# Patient Record
Sex: Male | Born: 1945
Health system: Southern US, Community
[De-identification: ages and names within clinical notes are randomized; demographics above are authoritative.]

## PROBLEM LIST (undated history)

## (undated) DIAGNOSIS — A048 Other specified bacterial intestinal infections: Secondary | ICD-10-CM

## (undated) DIAGNOSIS — G479 Sleep disorder, unspecified: Secondary | ICD-10-CM

## (undated) DIAGNOSIS — F419 Anxiety disorder, unspecified: Secondary | ICD-10-CM

## (undated) DIAGNOSIS — C439 Malignant melanoma of skin, unspecified: Secondary | ICD-10-CM

## (undated) DIAGNOSIS — I472 Ventricular tachycardia, unspecified: Secondary | ICD-10-CM

## (undated) DIAGNOSIS — M1990 Unspecified osteoarthritis, unspecified site: Secondary | ICD-10-CM

## (undated) DIAGNOSIS — E559 Vitamin D deficiency, unspecified: Secondary | ICD-10-CM

## (undated) DIAGNOSIS — E7800 Pure hypercholesterolemia, unspecified: Secondary | ICD-10-CM

## (undated) DIAGNOSIS — F4321 Adjustment disorder with depressed mood: Secondary | ICD-10-CM

## (undated) DIAGNOSIS — K219 Gastro-esophageal reflux disease without esophagitis: Secondary | ICD-10-CM

## (undated) DIAGNOSIS — R7303 Prediabetes: Secondary | ICD-10-CM

## (undated) DIAGNOSIS — F32A Depression, unspecified: Secondary | ICD-10-CM

## (undated) DIAGNOSIS — D369 Benign neoplasm, unspecified site: Secondary | ICD-10-CM

## (undated) DIAGNOSIS — E291 Testicular hypofunction: Secondary | ICD-10-CM

## (undated) DIAGNOSIS — J309 Allergic rhinitis, unspecified: Secondary | ICD-10-CM

## (undated) DIAGNOSIS — K635 Polyp of colon: Secondary | ICD-10-CM

## (undated) DIAGNOSIS — F329 Major depressive disorder, single episode, unspecified: Secondary | ICD-10-CM

## (undated) DIAGNOSIS — E039 Hypothyroidism, unspecified: Secondary | ICD-10-CM

## (undated) DIAGNOSIS — C4492 Squamous cell carcinoma of skin, unspecified: Secondary | ICD-10-CM

## (undated) DIAGNOSIS — G4700 Insomnia, unspecified: Secondary | ICD-10-CM

## (undated) HISTORY — DX: Unspecified osteoarthritis, unspecified site: M19.90

## (undated) HISTORY — DX: Polyp of colon: K63.5

## (undated) HISTORY — PX: POLYPECTOMY: SHX149

## (undated) HISTORY — DX: Depression, unspecified: F32.A

## (undated) HISTORY — DX: Gastro-esophageal reflux disease without esophagitis: K21.9

## (undated) HISTORY — DX: Prediabetes: R73.03

## (undated) HISTORY — DX: Squamous cell carcinoma of skin, unspecified: C44.92

## (undated) HISTORY — DX: Anxiety disorder, unspecified: F41.9

## (undated) HISTORY — DX: Sleep disorder, unspecified: G47.9

## (undated) HISTORY — PX: COLONOSCOPY: SHX174

## (undated) HISTORY — DX: Insomnia, unspecified: G47.00

## (undated) HISTORY — DX: Ventricular tachycardia: I47.2

## (undated) HISTORY — DX: Malignant melanoma of skin, unspecified: C43.9

## (undated) HISTORY — PX: TONSILLECTOMY: SUR1361

## (undated) HISTORY — DX: Pure hypercholesterolemia, unspecified: E78.00

## (undated) HISTORY — DX: Major depressive disorder, single episode, unspecified: F32.9

## (undated) HISTORY — DX: Testicular hypofunction: E29.1

## (undated) HISTORY — DX: Allergic rhinitis, unspecified: J30.9

## (undated) HISTORY — DX: Benign neoplasm, unspecified site: D36.9

## (undated) HISTORY — DX: Vitamin D deficiency, unspecified: E55.9

## (undated) HISTORY — DX: Hypothyroidism, unspecified: E03.9

## (undated) HISTORY — DX: Ventricular tachycardia, unspecified: I47.20

## (undated) HISTORY — DX: Adjustment disorder with depressed mood: F43.21

## (undated) HISTORY — DX: Other specified bacterial intestinal infections: A04.8

---

## 1999-10-21 ENCOUNTER — Encounter: Admission: RE | Admit: 1999-10-21 | Discharge: 1999-10-21 | Payer: Self-pay | Admitting: Family Medicine

## 1999-10-21 ENCOUNTER — Encounter: Payer: Self-pay | Admitting: Family Medicine

## 2000-07-26 ENCOUNTER — Ambulatory Visit (HOSPITAL_COMMUNITY): Admission: RE | Admit: 2000-07-26 | Discharge: 2000-07-26 | Payer: Self-pay | Admitting: Gastroenterology

## 2002-03-02 ENCOUNTER — Encounter: Admission: RE | Admit: 2002-03-02 | Discharge: 2002-03-02 | Payer: Self-pay | Admitting: Otolaryngology

## 2002-03-02 ENCOUNTER — Encounter: Payer: Self-pay | Admitting: Otolaryngology

## 2003-04-07 ENCOUNTER — Inpatient Hospital Stay (HOSPITAL_COMMUNITY): Admission: EM | Admit: 2003-04-07 | Discharge: 2003-04-10 | Payer: Self-pay | Admitting: Emergency Medicine

## 2003-08-06 ENCOUNTER — Ambulatory Visit (HOSPITAL_COMMUNITY): Admission: RE | Admit: 2003-08-06 | Discharge: 2003-08-06 | Payer: Self-pay | Admitting: Internal Medicine

## 2004-10-16 ENCOUNTER — Ambulatory Visit: Payer: Self-pay | Admitting: Internal Medicine

## 2007-10-28 DIAGNOSIS — D369 Benign neoplasm, unspecified site: Secondary | ICD-10-CM

## 2007-10-28 HISTORY — DX: Benign neoplasm, unspecified site: D36.9

## 2010-01-05 HISTORY — PX: LEFT HEART CATH: CATH118248

## 2010-05-23 NOTE — Consult Note (Signed)
NAME:  Samuel Turner, Samuel Turner NO.:  [id number]   MEDICAL RECORD NO.:  [id number]                   PATIENT TYPE:  INP   LOCATION:  2024                                 FACILITY:  [facility]   PHYSICIAN:  Doylene Canning. Ladona Ridgel, M.D.               DATE OF BIRTH:  [birth year]   DATE OF CONSULTATION:  04/10/2003  DATE OF DISCHARGE:  04/10/2003                                   CONSULTATION   REQUESTING PHYSICIAN:  Armanda Magic, M.D.   REASON FOR CONSULTATION:  Evaluation of symptomatic, nonsustained, wide QRS  tachycardia.   HISTORY OF PRESENT ILLNESS:  The patient is a 65 year old man with a history  of 4 weeks' of palpitations associated with dizziness and near-syncope.  He  denies frank syncope. Also, he has intermittent chest pain. These are  sometimes with, but sometimes now with his palpitations.  The patient wore a  cardiac monitor which demonstrated several runs of nonsustained ventricular  tachycardia at cycle lengths between 280-300 msec.  He was now referred for  additional evaluation and treatment.  He denies any history of syncope or  near syncope.   His catheterization demonstrated normal coronary arteries and normal left  ventricular systolic function.   PAST MEDICAL HISTORY:  Additionally notable for some depression and history  of dyslipidemia.   PAST SURGICAL HISTORY:  Tonsillectomy.   ALLERGIES:  PREDNISONE.   MEDICATIONS:  Xanax p.r.n. and Wellbutrin.   SOCIAL HISTORY:  Patient is married. He denies tobacco abuse with no smoking  for 20 years, rarely uses any alcoholic beverages. He does drink caffeinated  beverages.   REVIEW OF SYSTEMS:  Negative for vision or hearing problems. He denies any  fevers, chills or night sweats, denies headache. He does wear glasses for  reading. Denies any skin rashes. He has chest pain as previously noted.  Denies shortness of breath, dyspnea, orthopnea, syncope, claudication, cough  or hemoptysis. He does feel  palpitations and presyncope as previously noted,  which have been demonstrated to be due to his nonsustained VT.  He denies  dysuria, hematuria or nocturia, weakness or numbness. He does have rare  depression.  Denies nausea or vomiting, constipation, polyuria or  polydipsia.  NEUROLOGIC:  Unremarkable.   PHYSICAL EXAMINATION:  VITAL SIGNS:  Blood pressure 128/64, pulse 64 and  regular, respirations 18, temperature 97.1, weight 180 pounds.  GENERAL:  A pleasant, well-appearing middle-aged man in no distress.  HEENT:  Normocephalic, atraumatic.  Pupils are equal, round. Oropharynx  moist, sclerae anicteric.  NECK:  No jugulovenous distention, no thyromegaly, trachea midline. Carotids  are 2+ and symmetric.  LUNGS:  Clear bilaterally to auscultation. There are no wheezes, rales or  rhonchi.  CARDIOVASCULAR:  Regular rate and rhythm, normal S1 and S2. No murmurs, rubs  or gallops.  ABDOMEN:  Soft, nontender, nondistended. No organomegaly.  EXTREMITIES:  No clubbing, cyanosis or edema.  Pulses 2+ and symmetric.  SKIN:  No lesions or erythematous macules or papules.  NEUROLOGIC:  Alert and oriented x3. Cranial nerves II-XII grossly intact.  Strength 5/5 and symmetric.   LABORATORY DATA:  EKG demonstrated sinus rhythm with a delay in his R wave  progression from V1-V3.  There is a small, terminal deflection in lead V2,  though I do not think this is diagnostic of an epsilon wave.   IMPRESSION:  1. Symptomatic nonsustained ventricular tachycardia and premature     ventricular contractions.  2. History of depression.  3. Dyslipidemia.   DISCUSSION:  The mechanism of the patient's symptoms are unclear.  With  normal LV function and no coronary disease, they are likely not life-  threatening, although he could certainly still have arrhythmogenic, right  ventricular dysplasia.   I would recommend he undergo electrophysiologic study. If he has RV outflow  tract which is sustained; then  catheter placement would be warranted. If he  has no sustained, inducible arrhythmias, then medical therapy with beta-  blockers would be warranted. If the patient did have evidence of  arrhythmogenic RV dysplasia or other infiltrating cardiomyopathies with  multiple inducible VT's, with programmed stimulation, then ICD insertion  would be warranted.                                               Doylene Canning. Ladona Ridgel, M.D.    GWT/MEDQ  D:  04/09/2003  T:  04/10/2003  Job:  [id number]   cc:   Armanda Magic, M.D.  [address], [address]  [place], [place] [address]  Fax: [phone]   [facility] [employer]

## 2010-05-23 NOTE — Procedures (Signed)
[facility]  Patient:    Samuel Turner, Samuel Turner                      MRN: [id number] Proc. Date: 07/26/00 Adm. Date:  [id number] Attending:  Louie Bun CC:         Elana Alm Eliezer Lofts., M.D.   Procedure Report  PROCEDURE:  Colonoscopy.  INDICATION FOR PROCEDURE:  Screening colonoscopy in a 65 year old patient with no recent colon screening.  DESCRIPTION OF PROCEDURE:  The patient was placed in the left lateral decubitus position and placed on the pulse monitor with continuous low-flow oxygen delivered by nasal cannula.  He was sedated with 4 mg IV Demerol and 50 mg IV Versed in addition to the medications received for the previous EGD. The Olympus video colonoscope was inserted into the rectum and advanced to the cecum, confirmed by transillumination at McBurneys point and visualization of the ileocecal valve and appendiceal orifice.  The prep was somewhat suboptimal proximal to the hepatic flexure with some semisoft stool adherent to the walls of the mucosa despite fairly vigorous syringe irrigation, and I could not rule out lesions less than 1 cm in all areas, particularly proximal to the hepatic flexure.  Otherwise, the cecum, ascending, transverse, descending, and sigmoid colon all appeared normal with no masses, polyps, diverticula, or other mucosal abnormalities.  The rectum likewise appeared normal, and retroflex view of the anus did reveal some small internal hemorrhoids.  The colonoscope was then withdrawn, and the patient returned to the recovery room in stable condition.  He tolerated the procedure well, and there were no immediate complications.  IMPRESSION:  Internal hemorrhoids, otherwise normal colonoscopy.  PLAN:  Average risk colon screening with repeat flexible sigmoidoscopy and Hemoccults in five years. DD:  07/26/00 TD:  07/24/00 Job: [id number]

## 2010-05-23 NOTE — Cardiovascular Report (Signed)
NAME:  Samuel Turner, Samuel Turner                         ACCOUNT NO.:  [id number]   MEDICAL RECORD NO.:  [id number]                   PATIENT TYPE:  INP   LOCATION:  2024                                 FACILITY:  [facility]   PHYSICIAN:  Francisca December, M.D.               DATE OF BIRTH:  [birth year]   DATE OF PROCEDURE:  04/09/2003  DATE OF DISCHARGE:                              CARDIAC CATHETERIZATION   PROCEDURE PERFORMED:  1. Left heart catheterization.  2. Coronary angiography.  3. Left ventriculogram.   INDICATION:  Mr. Glosser is a 65 year old man who was admitted to [facility] April 06, 2003 with chest pain and nonsustained ventricular  tachycardia.  He has ruled out for myocardial infarction.  He is brought now  to catheterization laboratory to identify possible coronary artery disease  as an etiology.   PROCEDURAL NOTE:  The patient was brought to the cardiac catheterization  laboratory in the fasting state.  The right groin was prepped and draped in  the usual sterile fashion.  Local anesthesia was obtained with the  infiltration of 1% lidocaine.  A 6 French catheter sheath was inserted  percutaneously into the right femoral artery utilizing an anterior approach  over a guiding J wire.  A 110-cm pigtail catheter was used to measure  pressures in the ascending aorta and in the left ventricle both prior to and  following the ventriculogram.  A 30-degree RAO cine left ventriculogram was  performed utilizing a power injector.  A 6 French #4 left and right Judkins  catheters were then used to perform a cineangiography of each coronary  artery in multiple LAO and RAO projections.  All catheter manipulations were  performed using fluoroscopic observation and exchanges performed over long  guiding J wire.  At the completion of the procedure, the catheter and  catheter sheaths were removed.  Hemostasis was achieved by direct pressure.  The patient was transported to the recovery area in  stable condition with an  intact distal pulse.   HEMODYNAMICS:  Systemic arterial pressure was 110/63 with a mean of 84 mmHg.  There was no systolic gradient across the aortic valve.  The left  ventricular end-diastolic pressure was 10 mmHg.   ANGIOGRAPHY:  The left ventriculogram on both injections resulted in a  significant degree of ventricular tachycardia.  There were no regional wall  motion abnormalities and left ventricular systolic function was well within  normal limits.   There was a right dominant coronary system present.  The main left coronary  artery was normal.   The left anterior descending artery and its branches were without  significant obstruction.  There is a 30% focal stenosis in the distal  portion after the origin of a first large diagonal, but anatomically the  third diagonal.  The anterior descending reaches but does not traverse the  apex.   The left circumflex coronary artery was without significant  obstruction.  The vessel really amounts to a single large marginal branch.  It bifurcates  on the lateral wall of the heart.  It has no obstructions whatsoever.   The right coronary artery and its branches were similarly free of disease.  No obstructions are seen whatsoever.  It gives rise to a moderate size  posterior descending artery and a moderate size posterior lateral segment  with two left ventricular branches.  No obstructions are seen in these  branch arteries.  Collateral vessels are not seen.   FINAL IMPRESSION:  1. Normal left ventricular size and global systolic function.  Ejection     fraction 65-70%.  2. No evidence of significant atherosclerotic coronary vascular disease.                                               Francisca December, M.D.    JHE/MEDQ  D:  04/09/2003  T:  04/10/2003  Job:  [id number]   cc:   Molly Maduro A. Nicholos Johns, M.D.  [address]  [place]  [place] [address]  Fax: [phone]

## 2010-05-23 NOTE — Discharge Summary (Signed)
NAME:  Samuel Turner, Samuel Turner                         ACCOUNT NO.:  [id number]   MEDICAL RECORD NO.:  [id number]                   PATIENT TYPE:  INP   LOCATION:  2024                                 FACILITY:  [facility]   PHYSICIAN:  Armanda Magic, M.D.                  DATE OF BIRTH:  1945-10-08   DATE OF ADMISSION:  04/07/2003  DATE OF DISCHARGE:  04/10/2003                                 DISCHARGE SUMMARY   CHIEF COMPLAINT/REASON FOR ADMISSION:  Mr. Urwin was evaluated in the ER.  He is 65 years old.  He is complaining of palpitations and chest pain.  He  has no prior history of cardiac disease.  He has had palpitations for about  1-1/2 months.  Over the past year, they have been increasing in frequency.  He was wearing a monitor on Friday and was noted at the [facility] Cardiology  office to have had nonsustained ventricular tachycardia, 16 beats  episodically.  The patient was notified and recommended that he come to the  ER, but he wanted to wait and be seen on Monday.  In the p.m. and prior to  coming to the ER and on the morning if coming to the ER, the patient had an  episode of palpitations and chest pain and presented to the ER.  He has also  been having chest for about 1-1/2 months intermittently.  This has increased  intermittently for about two times a day, of unclear duration.  The pain was  located midsternally and does not radiate.  He has no associated symptoms  except for dizziness.  This does occur with chest pain and with the  palpitations.  He notes that his palpitations increase with exertion such as  weight lifting or doing his usual daily work.  He does not have chest pain  with regular type of exercise.  Currently, he is experiencing a dull ache in  his chest.   FAMILY HISTORY:  Significant for a father who died in his 6's of coronary  artery disease.   PHYSICAL EXAMINATION:  VITAL SIGNS:  On initial exam, the patient blood  pressure was elevated slightly at 154/73,  heart rate 69.  He was afebrile.  GENERAL:  Physical examination was otherwise unremarkable.   EKG shows sinus rhythm, ventricular rate 69 beats per minute, no ST-T wave  changes.   DIAGNOSES:  The patient was admitted by Dr. Mayford Knife with the following  diagnoses:  1. Recurrent chest pain worrisome for unstable angina.  EKG is currently     without ischemic changes.  2. Know episode of nonsustained ventricular tachycardia in a patient with     palpitations.  Questionable underlying coronary artery disease.  3. Dizziness secondary to nonsustained ventricular tachycardia.  4. History of depression and attention deficit hyperactivity disorder,     currently on medications.   HOSPITAL COURSE:  1. Chest pain.  The patient  was admitted to the medical telemetry unit where     he was started on Lovenox, beta-blockers, as well as morphine and Tylenol     for pain.  He was also ordered sublingual nitroglycerin as well as IV     nitroglycerin as indicated for chest pain, and his usual home medications     were continued.  TSH was checked due to patient's palpitations, and     initial laboratory work was within normal limits.  Potassium 3.6, BUN 14,     creatinine 1.2, hemoglobin 14.4, normal white count.  Platelets 242,000.     Initial cardiac isoenzymes were negative.  On April 10, 2003, Dr. Mayford Knife     determined that the patient needed to undergo cardiac catheterization to     determine if there was any ischemic etiology to the chest pain as well to     the palpitations, especially with nonsustained ventricular tachycardia.     The patient underwent cardiac catheterization on April 09, 2003, per Dr.     Francisca December.  It was a normal catheterization with normal LV     function, no coronary artery disease.  On April 09, 2003, Dr. Mayford Knife     called electrophysiology physician to come and evaluate the patient.  Dr.     Ladona Ridgel did evaluate the patient on that same day due to the patient's      problems with symptomatic nonsustained wide QRS tachycardia.  Their     discussion revealed that the patient's symptoms were unclear.  He had     normal LV function, no coronary disease, and felt that the nonsustained     ventricular tachycardia was likely not life threatening although he could     certainly have arrhythmogenic right ventricular dysplasia.  He     recommended that the patient undergo electrophysiology study.  If he had     RV outflow tract which is sustained, then catheter placement would be     warranted.  If no sustained inducible arrhythmias, then medical therapy     with beta blockers warranted.   If he did have evidence of arrhythmogenic RV dysplasia or other infiltrating  cardiomyopathies with multiple inducible VT's with program stimulation, then  ICD insertion would be warranted.   The patient did undergo an EP study on May 10, 2003, without any clear  evidence of arrhythmogenic RV dysplasia or other infiltrating  cardiomyopathies, and the recommendations were to continue beta-blockers  increasing as the patient can tolerate, and if the patient did not tolerate  Lopressor, consider using Inderal LA or nadolol as well, and could use a  calcium channel blocker as well to control the ventricular rate.  By May 10, 2003, based on all these findings, Dr. Mayford Knife determined that the patient  was stable for discharge home, and the patient agreed.  The patient was  discharged home on the above-stated date.   LABORATORY DATA:  On date of discharge, revealed that the patient's  hemoglobin was 12.9, white count 6900, platelet count 220,000.  Potassium  3.8.  BUN 16, creatinine 1.1.  TSH was 1.423.  A lipid panel had also been  checked this hospitalization with cholesterol of 200, triglycerides  72.  HDL 81, and LDL 105.   DISCHARGE DIAGNOSES:  1. Chest pain, non-cardiac.  Normal cardiac catheterization. 2. Nonsustained ventricular tachycardia, nonischemic with unremarkable  EP     study.  Normal TSH.  3. Borderline elevated LDL.  The patient  does not have known coronary artery     disease, or finding at his cath.  The LDL of 105 is appropriate at this     point.   OTHER MEDICAL PROBLEMS:  As listed previously.   DISCHARGE MEDICATIONS:  1. Lopressor 50 mg b.i.d.  2. Aspirin 325 mg daily.  3. Wellbutrin XL 300 mg daily.  4. Xanax as needed.   DISCHARGE DIET:  Cardiac, heart healthy.   WOUND CARE:  Routine post EP study.  No bending, stooping, straining or  lifting greater than five pounds for the next two days.   OTHER INSTRUCTIONS:  1. Call if you begin experiencing further palpitations.  2. Follow up appointment to see Dr. Mayford Knife on April 20 at 1:15 p.m.  3. He is also to have a cardiac MRI as an outpatient to rule out RV     dysplasia.  4. Again, the EP study did not reveal any evidence of this, but it is     recommended that he have a cardiac MRI to fully evaluate this.      Allison L. Ulla Gallo, M.D.    ALE/MEDQ  D:  06/13/2003  T:  06/14/2003  Job:  [id number]   cc:   Doylene Canning. Ladona Ridgel, M.D.   Rosanne Gutting  [address]  [place]  [place] [address]  Fax: [phone]

## 2010-05-23 NOTE — Procedures (Signed)
[facility]  Patient:    GRAY, DOERING                      MRN: [id number] Proc. Date: 07/26/00 Adm. Date:  [id number] Attending:  Louie Bun CC:         Elana Alm Eliezer Lofts., M.D.   Procedure Report  PROCEDURE:  Esophagogastroduodenoscopy.  INDICATION FOR PROCEDURE:  Gastroesophageal reflux in a patient set up for colonoscopy immediately to follow this procedure.  DESCRIPTION OF PROCEDURE:   The patient was placed in the left lateral decubitus position and placed on the pulse monitor with continuous low-flow oxygen delivered by nasal cannula.  He was sedated with 50 mg IV Demerol and 6 mg IV Versed.  The Olympus video endoscope was advanced under direct vision into the oropharynx and esophagus.  The esophagus was straight and of normal caliber with the squamocolumnar line at 38 cm.  There was a small, approximately 1 cm hiatal hernia and a 1 cm esophageal erosion with no exudate or ulceration, consistent with a grade 1 esophagitis.  The stomach was entered, and a small amount of liquid secretions were suctioned from the fundus.  Retroflex view of the cardia was unremarkable.  The fundus, body, antrum, and pylorus all appeared normal.  The duodenum was entered, and both the bulb and the second portion were well inspected and appeared to be within normal limits.  The scope was then withdrawn and the patient prepared for colonoscopy.  He tolerated the procedure well, and there were no immediate complications.  IMPRESSION: 1. Grade 1 esophagitis. 2. Small hiatal hernia.  PLAN:  Dietary and lifestyle modifications for reflux and medical therapy as needed. DD:  07/26/00 TD:  07/24/00 Job: [id number]

## 2010-05-23 NOTE — Op Note (Signed)
NAME:  Samuel Turner, Samuel Turner NO.:  [id number]   MEDICAL RECORD NO.:  [id number]                   PATIENT TYPE:  INP   LOCATION:  2024                                 FACILITY:  [facility]   PHYSICIAN:  Duke Salvia, M.D.               DATE OF BIRTH:  December 26, 1945   DATE OF PROCEDURE:  04/10/2003  DATE OF DISCHARGE:  04/10/2003                                 OPERATIVE REPORT   PREOPERATIVE DIAGNOSIS:  Nonsustained ventricular tachycardia.   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   PROCEDURE:  Invasive electrophysiological study with isoproterenol infusion.   Following obtaining informed consent, the patient was brought to the  electrophysiology laboratory and placed on the fluoroscopic table in the  supine position.  After routine prep and drape, cardiac catheterization was  performed with local anesthesia and conscious sedation.  Noninvasive blood  pressure monitoring and transcutaneous oxygen saturation monitoring were  performed continuously throughout the procedure.  Following the procedure,  the catheters were removed and hemostasis was obtained, and the patient was  transferred to the floor in stable condition.   The catheter was a 5-French quadripolar catheter that was inserted into the  left femoral vein to the right ventricular apex and subsequently moved to  the right ventricular outflow tract.   A 5-French quadripolar catheter was inserted in the left femoral vein in the  A-V junction and then moved to the high right atrium.   Source leads I, aVF and V1 were monitored continuously throughout the  procedure.  Following insertion of the catheters, the stimulation protocol  included incremental atrial pacing, intraventricular pacing, single atrial  external stimuli.   The paced cycle went to 600 milliseconds.  A single  double ventricular external stimuli from the right ventricular apex and the  paced cycle went to 350:500 milliseconds.  Double and triple ventricular  external stimuli from the right ventricular apex and the paced cycle went to  500:350 milliseconds.   RESULTS:  ELECTROCARDIOGRAM:  1. Rhythm is sinus.  2. Cycle length is 933 milliseconds.  3. PR interval is 193 milliseconds.  4. QRS duration is 106 milliseconds.  5. QT interval is 387 milliseconds.  6. P-wave duration was normal.  7. Bundle branch block was absent.  8. Pre-excitation was absent.   H interval was 106 milliseconds.  H-V interval was 59 milliseconds.   AV NODAL FUNCTION:  1. The AV Wenckebach cycle length was 350 milliseconds.  2. VA conduction was dissociate at 600 milliseconds.  3. The AV nodal effective refractory period at a paced cycle length of 600     msec was 320 milliseconds.   ACCESSORY PATHWAY FUNCTION:  No evidence of an accessory pathway was  identified.   VENTRICULAR RESPONSE TO PROGRAM STIMULATION:  The effective refractory  period at  the right ventricular apex at a paced cycle length of 500  milliseconds was 240 milliseconds, and at 350 milliseconds was 220  milliseconds.   The closest coupling interval at the right ventricular apex at a paced cycle  length of 500 milliseconds was 240:200:200, and at 350 milliseconds was  230:210:210.   ARRHYTHMIAS INDUCED:  Repeated episodes of nonsustained ventricular  tachycardia were induced in the setting of isoproterenol at 2-4 mcg/minute  using primarily burst ventricular stimulation.  Programmed stimulation  failed to induce the tachycardia.   IMPRESSION:  1. Normal sinus function.  2. Normal atrial function.  3. Normal A-V nodal function.  4. Normal __________  function.  5. No accessory pathway.  6. Ventricular testing demonstrated the presence of an automatic right     ventricular tachycardia  that appeared to originate by morphology criteria     from the right ventricular outflow tract.  This behavior, i.e. his     response to burst stimulation and isoproterenol infusion were consistent     with automatic focus and more consistent with right ventricular outflow     tract ventricular tachycardia as opposed to arrhythmogenic RV dysplasia.                                               Duke Salvia, M.D.    SCK/MEDQ  D:  04/13/2003  T:  04/15/2003  Job:  [id number]   cc:   Armanda Magic, M.D.  [address], [address]  [place], [place] [address]  Fax: [phone]   Electrophysiology Laboratory

## 2012-08-11 ENCOUNTER — Encounter: Payer: Self-pay | Admitting: Internal Medicine

## 2012-09-12 ENCOUNTER — Encounter: Payer: Self-pay | Admitting: Internal Medicine

## 2012-09-13 ENCOUNTER — Other Ambulatory Visit (INDEPENDENT_AMBULATORY_CARE_PROVIDER_SITE_OTHER): Payer: 59

## 2012-09-13 ENCOUNTER — Encounter: Payer: Self-pay | Admitting: Internal Medicine

## 2012-09-13 ENCOUNTER — Ambulatory Visit (INDEPENDENT_AMBULATORY_CARE_PROVIDER_SITE_OTHER): Payer: 59 | Admitting: Internal Medicine

## 2012-09-13 VITALS — BP 118/62 | HR 70 | Ht 72.0 in | Wt 186.0 lb

## 2012-09-13 DIAGNOSIS — Z86010 Personal history of colonic polyps: Secondary | ICD-10-CM

## 2012-09-13 DIAGNOSIS — R195 Other fecal abnormalities: Secondary | ICD-10-CM

## 2012-09-13 DIAGNOSIS — Z860101 Personal history of adenomatous and serrated colon polyps: Secondary | ICD-10-CM | POA: Insufficient documentation

## 2012-09-13 DIAGNOSIS — R141 Gas pain: Secondary | ICD-10-CM

## 2012-09-13 DIAGNOSIS — R140 Abdominal distension (gaseous): Secondary | ICD-10-CM

## 2012-09-13 DIAGNOSIS — Z8619 Personal history of other infectious and parasitic diseases: Secondary | ICD-10-CM

## 2012-09-13 LAB — TSH: TSH: 1.01 u[IU]/mL (ref 0.35–5.50)

## 2012-09-13 MED ORDER — ALIGN PO CAPS: 1.0000 | ORAL_CAPSULE | Freq: Every day | ORAL | Status: DC

## 2012-09-13 MED ORDER — PEG-KCL-NACL-NASULF-NA ASC-C 100 G PO SOLR: 1.0000 | Freq: Once | ORAL | Status: DC

## 2012-09-13 NOTE — Patient Instructions (Addendum)
You have been scheduled for a colonoscopy with propofol. Please follow written instructions given to you at your visit today.  Please pick up your prep kit at the pharmacy within the next 1-3 days. If you use inhalers (even only as needed), please bring them with you on the day of your procedure. Your physician has requested that you go to www.startemmi.com and enter the access code given to you at your visit today. This web site gives a general overview about your procedure. However, you should still follow specific instructions given to you by our office regarding your preparation for the procedure.  Your physician has requested that you go to the basement for lab work before leaving today.  We have sent the following medications to your pharmacy for you to pick up at your convenience: Moviprep; you were given instructions today at your visit                                               We are excited to introduce MyChart, a new best-in-class service that provides you online access to important information in your electronic medical record. We want to make it easier for you to view your health information - all in one secure location - when and where you need it. We expect MyChart will enhance the quality of care and service we provide.  When you register for MyChart, you can:    View your test results.    Request appointments and receive appointment reminders via email.    Request medication renewals.    View your medical history, allergies, medications and immunizations.    Communicate with your physician's office through a password-protected site.    Conveniently print information such as your medication lists.  To find out if MyChart is right for you, please talk to a member of our clinical staff today. We will gladly answer your questions about this free health and wellness tool.  If you are age 23 or older and want a member of your family to have access to your record, you must  provide written consent by completing a proxy form available at our office. Please speak to our clinical staff about guidelines regarding accounts for patients younger than age 67.  As you activate your MyChart account and need any technical assistance, please call the MyChart technical support line at [phone]) or email your question to [email or web address]. If you email your question(s), please include your name, a return phone number and the best time to reach you.  If you have non-urgent health-related questions, you can send a message to our office through MyChart at [email or web address]. If you have a medical emergency, call 911.  Thank you for using MyChart as your new health and wellness resource!   MyChart licensed from [employer],  1191-4782. Patents Pending.

## 2012-09-13 NOTE — Progress Notes (Signed)
Patient ID: Samuel Turner, male   DOB: 11/03/1945, 67 y.o.   MRN: [id number] HPI: Mr. Gentles is a 67 year old male with a past medical history of adenomatous colon polyp, removed H. pylori infection, and depression who is seen to discuss surveillance colonoscopy. He is here alone today. He reports he is feeling well. He does report intermittent, long-standing lower abdominal bloating, borborygmi and loose stools. This occurs infrequently maybe once every 2-3 weeks. His stools are not bloody nor melenic. On these days he'll have 4-5 stools a day. Otherwise he reports 1-2 formed brown stools daily. He has no abdominal discomfort, bloating or pain outside of his episodes of more loose stool. He reports a good appetite. No heartburn. No trouble swallowing. No nausea or vomiting.  He recalls colonoscopy in 2009 with one polyp removed and also an upper endoscopy which really H. pyloric gastritis. He reports being treated with antibiotics for this infection and having post treatment breath test which was reportedly negative  Past Medical History  Diagnosis Date  . Tubular adenoma 10/28/2007  . Depression   . H. pylori infection     History reviewed. No pertinent past surgical history.  Current Outpatient Prescriptions  Medication Sig Dispense Refill  . ALPRAZolam (XANAX) 0.5 MG tablet Take 0.5 mg by mouth at bedtime as needed for sleep.      Marland Kitchen aspirin 81 MG chewable tablet Chew 81 mg by mouth daily.      Marland Kitchen atenolol (TENORMIN) 25 MG tablet Take 25 mg by mouth daily.      Marland Kitchen buPROPion (WELLBUTRIN XL) 300 MG 24 hr tablet Take 300 mg by mouth daily.      . cholecalciferol (VITAMIN D) 1000 UNITS tablet Take 1,000 Units by mouth daily.      . Multiple Vitamin (MULTIVITAMIN) tablet Take 1 tablet by mouth daily.      . Omega-3 Fatty Acids (FISH OIL) 1000 MG CAPS Take 1 capsule by mouth daily.      Marland Kitchen zolpidem (AMBIEN) 10 MG tablet Take 5 mg by mouth at bedtime as needed for sleep.       No current  facility-administered medications for this visit.    No Known Allergies  Family History  Problem Relation Age of Onset  . Colon cancer Paternal Grandmother   . Ovarian cancer Mother   . Heart disease Father     History  Substance Use Topics  . Smoking status: Former Games developer  . Smokeless tobacco: Never Used  . Alcohol Use: Yes    ROS: As per history of present illness, otherwise negative  BP 118/62  Pulse 70  Ht 6' (1.829 m)  Wt 186 lb (84.369 kg)  BMI 25.22 kg/m2 Constitutional: Well-developed and well-nourished. No distress. HEENT: Normocephalic and atraumatic. Oropharynx is clear and moist. No oropharyngeal exudate. Conjunctivae are normal.  No scleral icterus. Neck: Neck supple. Trachea midline. Cardiovascular: Normal rate, regular rhythm and intact distal pulses. No M/R/G Pulmonary/chest: Effort normal and breath sounds normal. No wheezing, rales or rhonchi. Abdominal: Soft, nontender, nondistended. Bowel sounds active throughout. There are no masses palpable. No hepatosplenomegaly. Very small scar left lateral to umbilicus Extremities: no clubbing, cyanosis, or edema Lymphadenopathy: No cervical adenopathy noted. Neurological: Alert and oriented to person place and time. Skin: Skin is warm and dry. No rashes noted. Psychiatric: Normal mood and affect. Behavior is normal.  EGD and colonoscopy performed on 10/28/2007 by Dr. Mariana Kaufman Upper endoscopy revealed normal esophagus, nodular gastric mucosa with cold forceps biopsy and  a normal duodenum. Colonoscopy to the cecum. The 5 mm polyp in the rectum removed with cold snare. Pathology results = tubular adenoma from rectal polyp.  Gastric biopsies = mild to moderate active chronic gastritis with mild foveolar hyperplasia. Helicobacter organisms identified  ASSESSMENT/PLAN:  67 year old male with a past medical history of adenomatous colon polyp, removed H. pylori infection, and depression who is seen to discuss  surveillance colonoscopy.  1.  Hx of adenomatous colon polyp -- I recommended repeat colonoscopy in October which will be 5 years from his last exam. This is based on current nationally recognized guidelines for adenoma surveillance. The test was discussed and he is agreeable to proceed.  2.  History of H. Pylori -- I recommended an H. pylori stool antigen to confirm eradication.  3.  Intermittent loose stools/borborygmi -- these symptoms are unpredictable for him and long-standing. We will confirm H. pylori eradication as discussed in #2. Colonoscopy also as discussed to #1 also help rule out any colonic pathology.  These symptoms seem somewhat irritable in nature. We discussed lactose intolerance, and this does not seem to be the case. I will check a TSH today and celiac panel. I recommended a daily probiotic, either Aign or Florastor OTC per box instructions.  We can assess his response to probiotic at the time of his colonoscopy in October

## 2012-09-15 LAB — HELICOBACTER PYLORI  SPECIAL ANTIGEN: H. PYLORI Antigen: NEGATIVE

## 2012-09-23 ENCOUNTER — Encounter: Payer: Self-pay | Admitting: Gastroenterology

## 2012-10-21 ENCOUNTER — Other Ambulatory Visit: Payer: Self-pay | Admitting: Gastroenterology

## 2012-10-21 ENCOUNTER — Encounter: Payer: Self-pay | Admitting: Internal Medicine

## 2012-10-21 ENCOUNTER — Other Ambulatory Visit (INDEPENDENT_AMBULATORY_CARE_PROVIDER_SITE_OTHER): Payer: 59

## 2012-10-21 ENCOUNTER — Ambulatory Visit (AMBULATORY_SURGERY_CENTER): Payer: 59 | Admitting: Internal Medicine

## 2012-10-21 VITALS — BP 115/60 | HR 62 | Temp 96.2°F | Resp 11 | Ht 72.0 in | Wt 186.0 lb

## 2012-10-21 DIAGNOSIS — R197 Diarrhea, unspecified: Secondary | ICD-10-CM

## 2012-10-21 DIAGNOSIS — Z8619 Personal history of other infectious and parasitic diseases: Secondary | ICD-10-CM

## 2012-10-21 DIAGNOSIS — Z1211 Encounter for screening for malignant neoplasm of colon: Secondary | ICD-10-CM

## 2012-10-21 DIAGNOSIS — Z86010 Personal history of colonic polyps: Secondary | ICD-10-CM

## 2012-10-21 LAB — CBC
Hemoglobin: 14.2 g/dL (ref 13.0–17.0)
Platelets: 214 10*3/uL (ref 150.0–400.0)
RBC: 4.37 Mil/uL (ref 4.22–5.81)
RDW: 12.9 % (ref 11.5–14.6)
WBC: 4.6 10*3/uL (ref 4.5–10.5)

## 2012-10-21 LAB — COMPREHENSIVE METABOLIC PANEL
ALT: 23 U/L (ref 0–53)
AST: 27 U/L (ref 0–37)
CO2: 27 mEq/L (ref 19–32)
Calcium: 8.9 mg/dL (ref 8.4–10.5)
Chloride: 104 mEq/L (ref 96–112)
Creatinine, Ser: 1.1 mg/dL (ref 0.4–1.5)
GFR: 71.92 mL/min (ref >60.00)
Glucose, Bld: 87 mg/dL (ref 70–99)
Sodium: 140 mEq/L (ref 135–145)
Total Protein: 7 g/dL (ref 6.0–8.3)

## 2012-10-21 MED ORDER — SODIUM CHLORIDE 0.9 % IV SOLN: 500.0000 mL | INTRAVENOUS | Status: DC

## 2012-10-21 NOTE — Progress Notes (Signed)
Report to pacu rn, vss, bbs=clear 

## 2012-10-21 NOTE — Progress Notes (Addendum)
Patient did not have preoperative order for IV antibiotic SSI prophylaxis. (G8918)  Patient did not experience any of the following events: a burn prior to discharge; a fall within the facility; wrong site/side/patient/procedure/implant event; or a hospital transfer or hospital admission upon discharge from the facility. (G8907)  

## 2012-10-21 NOTE — Progress Notes (Signed)
No egg or soy allergy. ewm 

## 2012-10-21 NOTE — Op Note (Signed)
[facility] [address] [place], [address]   COLONOSCOPY PROCEDURE REPORT  PATIENT: Samuel Turner, Samuel Turner  MR#: [id number] BIRTHDATE: 01/20/1945 , 67  yrs. old GENDER: Male ENDOSCOPIST: Beverley Fiedler, MD PROCEDURE DATE:  10/21/2012 PROCEDURE:   Colonoscopy, surveillance First Screening Colonoscopy - Avg.  risk and is 50 yrs.  old or older - No.  Prior Negative Screening - Now for repeat screening. N/A  History of Adenoma - Now for follow-up colonoscopy & has been > or = to 3 yrs.  Yes hx of adenoma.  Has been 3 or more years since last colonoscopy.  Polyps Removed Today? Yes. ASA CLASS:   Class II INDICATIONS:elevated risk screening, Patient's personal history of adenomatous colon polyps, and Last colonoscopy performed 5 years ago. MEDICATIONS: MAC sedation, administered by CRNA and propofol (Diprivan) 300mg  IV  DESCRIPTION OF PROCEDURE:   After the risks benefits and alternatives of the procedure were thoroughly explained, informed consent was obtained.  A digital rectal exam revealed no rectal mass.   The LB [id number]  endoscope was introduced through the anus and advanced to the cecum, which was identified by both the appendix and ileocecal valve. No adverse events experienced. The quality of the prep was good, using MoviPrep  The instrument was then slowly withdrawn as the colon was fully examined.      COLON FINDINGS: Small non-bleeding angiodysplastic lesion was found in the ascending colon.   The colon was otherwise normal.  There was no diverticulosis, inflammation, polyps or cancers identified. Retroflexed views revealed small internal hemorrhoids. The time to cecum=4 minutes 31 seconds.  Withdrawal time=11 minutes 23 seconds. The scope was withdrawn and the procedure completed.  COMPLICATIONS: There were no complications.  ENDOSCOPIC IMPRESSION: 1.   Small angiodysplastic lesion and in the ascending colon 2.   The colon was otherwise  normal  RECOMMENDATIONS: Given your personal history of adenomatous (pre-cancerous) polyps, you will need a repeat colonoscopy in 5 years.   eSigned:  Beverley Fiedler, MD 10/21/2012 8:26 AM   cc: The Patient and Johny Blamer MD

## 2012-10-21 NOTE — Patient Instructions (Signed)
YOU HAD AN ENDOSCOPIC PROCEDURE TODAY AT THE [facility]: Refer to the procedure report that was given to you for any specific questions about what was found during the examination.  If the procedure report does not answer your questions, please call your gastroenterologist to clarify.  If you requested that your care partner not be given the details of your procedure findings, then the procedure report has been included in a sealed envelope for you to review at your convenience later.  YOU SHOULD EXPECT: Some feelings of bloating in the abdomen. Passage of more gas than usual.  Walking can help get rid of the air that was put into your GI tract during the procedure and reduce the bloating. If you had a lower endoscopy (such as a colonoscopy or flexible sigmoidoscopy) you may notice spotting of blood in your stool or on the toilet paper. If you underwent a bowel prep for your procedure, then you may not have a normal bowel movement for a few days.  DIET: Your first meal following the procedure should be a light meal and then it is ok to progress to your normal diet.  A half-sandwich or bowl of soup is an example of a good first meal.  Heavy or fried foods are harder to digest and may make you feel nauseous or bloated.  Likewise meals heavy in dairy and vegetables can cause extra gas to form and this can also increase the bloating.  Drink plenty of fluids but you should avoid alcoholic beverages for 24 hours.  ACTIVITY: Your care partner should take you home directly after the procedure.  You should plan to take it easy, moving slowly for the rest of the day.  You can resume normal activity the day after the procedure however you should NOT DRIVE or use heavy machinery for 24 hours (because of the sedation medicines used during the test).  Please go to the lab for bloodwork after your procedure.   Take the medicine recommended by Dr. Rhea Belton.  SYMPTOMS TO REPORT IMMEDIATELY: A gastroenterologist  can be reached at any hour.  During normal business hours, 8:30 AM to 5:00 PM Monday through Friday, call [phone].  After hours and on weekends, please call the GI answering service at [phone] who will take a message and have the physician on call contact you.   Following lower endoscopy (colonoscopy or flexible sigmoidoscopy):  Excessive amounts of blood in the stool  Significant tenderness or worsening of abdominal pains  Swelling of the abdomen that is new, acute  Fever of 100F or higher  FOLLOW UP: If any biopsies were taken you will be contacted by phone or by letter within the next 1-3 weeks.  Call your gastroenterologist if you have not heard about the biopsies in 3 weeks.  Our staff will call the home number listed on your records the next business day following your procedure to check on you and address any questions or concerns that you may have at that time regarding the information given to you following your procedure. This is a courtesy call and so if there is no answer at the home number and we have not heard from you through the emergency physician on call, we will assume that you have returned to your regular daily activities without incident.  SIGNATURES/CONFIDENTIALITY: You and/or your care partner have signed paperwork which will be entered into your electronic medical record.  These signatures attest to the fact that that the information above on  your After Visit Summary has been reviewed and is understood.  Full responsibility of the confidentiality of this discharge information lies with you and/or your care-partner. Rollene Fare for choosing [place] for your medical needs.

## 2012-10-24 ENCOUNTER — Telehealth: Payer: Self-pay | Admitting: *Deleted

## 2012-10-24 LAB — TISSUE TRANSGLUTAMINASE, IGA: Tissue Transglutaminase Ab, IgA: 7.4 U/mL (ref <20)

## 2012-10-24 NOTE — Telephone Encounter (Signed)
Left message that we called for f/u 

## 2015-01-09 MED FILL — AZITHROMYCIN 250 MG TABLET: 250 | 5 days supply | Qty: 6 | Fill #0

## 2015-02-08 DIAGNOSIS — E291 Testicular hypofunction: Secondary | ICD-10-CM | POA: Diagnosis not present

## 2015-02-22 DIAGNOSIS — E291 Testicular hypofunction: Secondary | ICD-10-CM | POA: Diagnosis not present

## 2015-03-05 MED FILL — ATENOLOL 25 MG TABLET: 25 | 90 days supply | Qty: 90 | Fill #1

## 2015-03-08 DIAGNOSIS — E291 Testicular hypofunction: Secondary | ICD-10-CM | POA: Diagnosis not present

## 2015-03-19 MED FILL — CYCLOBENZAPRINE 10 MG TAB: 10 | 30 days supply | Qty: 30 | Fill #0

## 2015-03-21 DIAGNOSIS — M545 Low back pain: Secondary | ICD-10-CM | POA: Diagnosis not present

## 2015-03-29 MED FILL — ZOLPIDEM TARTRATE 10 MG TAB: 10 | 30 days supply | Qty: 30 | Fill #0

## 2015-03-29 MED FILL — ALPRAZolam 0.5 MG TABS: 0.5 | 90 days supply | Qty: 90 | Fill #0

## 2015-04-08 DIAGNOSIS — E291 Testicular hypofunction: Secondary | ICD-10-CM | POA: Diagnosis not present

## 2015-04-08 DIAGNOSIS — N486 Induration penis plastica: Secondary | ICD-10-CM | POA: Diagnosis not present

## 2015-04-08 DIAGNOSIS — Z0000 Encounter for general adult medical examination without abnormal findings: Secondary | ICD-10-CM | POA: Diagnosis not present

## 2015-04-08 DIAGNOSIS — N5201 Erectile dysfunction due to arterial insufficiency: Secondary | ICD-10-CM | POA: Diagnosis not present

## 2015-04-12 DIAGNOSIS — E291 Testicular hypofunction: Secondary | ICD-10-CM | POA: Diagnosis not present

## 2015-04-16 DIAGNOSIS — E291 Testicular hypofunction: Secondary | ICD-10-CM | POA: Diagnosis not present

## 2015-04-26 DIAGNOSIS — E291 Testicular hypofunction: Secondary | ICD-10-CM | POA: Diagnosis not present

## 2015-05-17 DIAGNOSIS — E291 Testicular hypofunction: Secondary | ICD-10-CM | POA: Diagnosis not present

## 2015-06-07 DIAGNOSIS — E291 Testicular hypofunction: Secondary | ICD-10-CM | POA: Diagnosis not present

## 2015-06-24 MED FILL — ATENOLOL 25 MG TABLET: 25 | 30 days supply | Qty: 30 | Fill #0

## 2015-06-25 MED FILL — ZOLPIDEM TARTRATE 10 MG TAB: 10 | 30 days supply | Qty: 30 | Fill #0

## 2015-06-28 DIAGNOSIS — E291 Testicular hypofunction: Secondary | ICD-10-CM | POA: Diagnosis not present

## 2015-07-19 DIAGNOSIS — E291 Testicular hypofunction: Secondary | ICD-10-CM | POA: Diagnosis not present

## 2015-07-30 MED FILL — ATENOLOL 25 MG TABLET: 25 | 90 days supply | Qty: 90 | Fill #0 | Status: TO

## 2015-07-30 MED FILL — BUPROPION HCL XL 300 MG TAB: 300 | 90 days supply | Qty: 90 | Fill #0

## 2015-08-02 DIAGNOSIS — E291 Testicular hypofunction: Secondary | ICD-10-CM | POA: Diagnosis not present

## 2015-08-02 MED FILL — ALPRAZolam 0.5 MG TABS: 0.5 | 90 days supply | Qty: 90 | Fill #0

## 2015-08-06 DIAGNOSIS — E291 Testicular hypofunction: Secondary | ICD-10-CM | POA: Diagnosis not present

## 2015-08-12 MED FILL — ZOLPIDEM TARTRATE 10 MG TAB: 10 | 30 days supply | Qty: 30 | Fill #1

## 2015-09-06 DIAGNOSIS — E291 Testicular hypofunction: Secondary | ICD-10-CM | POA: Diagnosis not present

## 2015-09-20 DIAGNOSIS — E291 Testicular hypofunction: Secondary | ICD-10-CM | POA: Diagnosis not present

## 2015-09-23 MED FILL — ZOLPIDEM TARTRATE 10 MG TAB: 10 | 30 days supply | Qty: 30 | Fill #2

## 2015-10-09 DIAGNOSIS — E291 Testicular hypofunction: Secondary | ICD-10-CM | POA: Diagnosis not present

## 2015-10-09 DIAGNOSIS — F419 Anxiety disorder, unspecified: Secondary | ICD-10-CM | POA: Diagnosis not present

## 2015-10-09 DIAGNOSIS — E7800 Pure hypercholesterolemia, unspecified: Secondary | ICD-10-CM | POA: Diagnosis not present

## 2015-10-09 DIAGNOSIS — Z125 Encounter for screening for malignant neoplasm of prostate: Secondary | ICD-10-CM | POA: Diagnosis not present

## 2015-10-09 DIAGNOSIS — J301 Allergic rhinitis due to pollen: Secondary | ICD-10-CM | POA: Diagnosis not present

## 2015-10-09 DIAGNOSIS — I472 Ventricular tachycardia: Secondary | ICD-10-CM | POA: Diagnosis not present

## 2015-10-16 MED FILL — MONTELUKAST SOD 10 MG TAB: 10 | 90 days supply | Qty: 90 | Fill #0

## 2015-10-25 DIAGNOSIS — E291 Testicular hypofunction: Secondary | ICD-10-CM | POA: Diagnosis not present

## 2015-10-25 DIAGNOSIS — E860 Dehydration: Secondary | ICD-10-CM | POA: Diagnosis not present

## 2015-10-25 DIAGNOSIS — Z23 Encounter for immunization: Secondary | ICD-10-CM | POA: Diagnosis not present

## 2015-10-25 DIAGNOSIS — E7800 Pure hypercholesterolemia, unspecified: Secondary | ICD-10-CM | POA: Diagnosis not present

## 2015-10-29 MED FILL — ZOLPIDEM TARTRATE 10 MG TAB: 10 | 30 days supply | Qty: 30 | Fill #3

## 2015-10-29 MED FILL — ATENOLOL 25 MG TABLET: 25 | 30 days supply | Qty: 30 | Fill #0

## 2015-11-05 MED FILL — ALPRAZolam 0.5 MG TABS: 0.5 | 90 days supply | Qty: 90 | Fill #0

## 2015-11-06 MED FILL — CYCLOBENZAPRINE 10 MG TAB: 10 | 15 days supply | Qty: 30 | Fill #0

## 2015-11-08 DIAGNOSIS — E291 Testicular hypofunction: Secondary | ICD-10-CM | POA: Diagnosis not present

## 2015-11-08 DIAGNOSIS — E860 Dehydration: Secondary | ICD-10-CM | POA: Diagnosis not present

## 2015-11-11 MED FILL — predniSONE 10 MG TABS: 10 | 8 days supply | Qty: 20 | Fill #0

## 2015-11-11 MED FILL — MELOXICAM 15 MG TABLET: 15 | 30 days supply | Qty: 30 | Fill #0

## 2015-12-05 DIAGNOSIS — R195 Other fecal abnormalities: Secondary | ICD-10-CM | POA: Diagnosis not present

## 2015-12-05 DIAGNOSIS — E291 Testicular hypofunction: Secondary | ICD-10-CM | POA: Diagnosis not present

## 2015-12-05 DIAGNOSIS — I471 Supraventricular tachycardia: Secondary | ICD-10-CM | POA: Diagnosis not present

## 2015-12-05 DIAGNOSIS — Z0000 Encounter for general adult medical examination without abnormal findings: Secondary | ICD-10-CM | POA: Diagnosis not present

## 2015-12-05 DIAGNOSIS — L989 Disorder of the skin and subcutaneous tissue, unspecified: Secondary | ICD-10-CM | POA: Diagnosis not present

## 2015-12-05 MED FILL — ATENOLOL 25 MG TABLET: 25 | 30 days supply | Qty: 30 | Fill #0

## 2015-12-23 DIAGNOSIS — E291 Testicular hypofunction: Secondary | ICD-10-CM | POA: Diagnosis not present

## 2015-12-23 DIAGNOSIS — A084 Viral intestinal infection, unspecified: Secondary | ICD-10-CM | POA: Diagnosis not present

## 2015-12-23 MED FILL — ONDANSETRON HCL 4 MG TABLET: 4 | 7 days supply | Qty: 30 | Fill #0

## 2016-01-08 MED FILL — ATENOLOL 25 MG TABLET: 25 | 30 days supply | Qty: 30 | Fill #1

## 2016-01-09 DIAGNOSIS — E291 Testicular hypofunction: Secondary | ICD-10-CM | POA: Diagnosis not present

## 2016-01-30 MED FILL — ZOLPIDEM TARTRATE 10 MG TAB: 10 | 30 days supply | Qty: 30 | Fill #0

## 2016-01-31 MED FILL — ATENOLOL 25 MG TABLET: 25 | 30 days supply | Qty: 30 | Fill #2

## 2016-01-31 MED FILL — ALPRAZolam 0.5 MG TABS: 0.5 | 90 days supply | Qty: 90 | Fill #0

## 2016-02-21 DIAGNOSIS — E291 Testicular hypofunction: Secondary | ICD-10-CM | POA: Diagnosis not present

## 2016-02-21 DIAGNOSIS — E039 Hypothyroidism, unspecified: Secondary | ICD-10-CM | POA: Diagnosis not present

## 2016-02-25 DIAGNOSIS — E291 Testicular hypofunction: Secondary | ICD-10-CM | POA: Diagnosis not present

## 2016-02-25 DIAGNOSIS — N486 Induration penis plastica: Secondary | ICD-10-CM | POA: Diagnosis not present

## 2016-02-25 DIAGNOSIS — N5201 Erectile dysfunction due to arterial insufficiency: Secondary | ICD-10-CM | POA: Diagnosis not present

## 2016-03-05 DIAGNOSIS — E291 Testicular hypofunction: Secondary | ICD-10-CM | POA: Diagnosis not present

## 2016-03-10 DIAGNOSIS — N5201 Erectile dysfunction due to arterial insufficiency: Secondary | ICD-10-CM | POA: Diagnosis not present

## 2016-03-18 MED FILL — ATENOLOL 25 MG TABLET: 25 | 30 days supply | Qty: 30 | Fill #3

## 2016-03-19 DIAGNOSIS — E291 Testicular hypofunction: Secondary | ICD-10-CM | POA: Diagnosis not present

## 2016-04-09 DIAGNOSIS — E291 Testicular hypofunction: Secondary | ICD-10-CM | POA: Diagnosis not present

## 2016-04-28 MED FILL — ATENOLOL 25 MG TABLET: 25 | 30 days supply | Qty: 30 | Fill #4

## 2016-04-29 DIAGNOSIS — E291 Testicular hypofunction: Secondary | ICD-10-CM | POA: Diagnosis not present

## 2016-04-29 MED FILL — ZOLPIDEM TARTRATE 10 MG TAB: 10 | 30 days supply | Qty: 30 | Fill #1

## 2016-05-08 MED FILL — SILDENAFIL 100 MG TABLET: 100 | 30 days supply | Qty: 5 | Fill #0

## 2016-05-15 DIAGNOSIS — E291 Testicular hypofunction: Secondary | ICD-10-CM | POA: Diagnosis not present

## 2016-05-28 DIAGNOSIS — E291 Testicular hypofunction: Secondary | ICD-10-CM | POA: Diagnosis not present

## 2016-06-04 MED FILL — ATENOLOL 25 MG TABLET: 25 | 90 days supply | Qty: 90 | Fill #5 | Status: TO

## 2016-06-05 DIAGNOSIS — R232 Flushing: Secondary | ICD-10-CM | POA: Diagnosis not present

## 2016-06-05 DIAGNOSIS — I471 Supraventricular tachycardia: Secondary | ICD-10-CM | POA: Diagnosis not present

## 2016-06-05 DIAGNOSIS — F419 Anxiety disorder, unspecified: Secondary | ICD-10-CM | POA: Diagnosis not present

## 2016-06-05 DIAGNOSIS — E291 Testicular hypofunction: Secondary | ICD-10-CM | POA: Diagnosis not present

## 2016-06-05 DIAGNOSIS — E7800 Pure hypercholesterolemia, unspecified: Secondary | ICD-10-CM | POA: Diagnosis not present

## 2016-06-05 DIAGNOSIS — E559 Vitamin D deficiency, unspecified: Secondary | ICD-10-CM | POA: Diagnosis not present

## 2016-06-05 MED FILL — ALPRAZolam 0.5 MG TABS: 0.5 | 90 days supply | Qty: 90 | Fill #0

## 2016-06-11 DIAGNOSIS — E291 Testicular hypofunction: Secondary | ICD-10-CM | POA: Diagnosis not present

## 2016-06-25 DIAGNOSIS — E291 Testicular hypofunction: Secondary | ICD-10-CM | POA: Diagnosis not present

## 2016-07-09 DIAGNOSIS — J208 Acute bronchitis due to other specified organisms: Secondary | ICD-10-CM | POA: Diagnosis not present

## 2016-07-09 DIAGNOSIS — M62830 Muscle spasm of back: Secondary | ICD-10-CM | POA: Diagnosis not present

## 2016-07-09 DIAGNOSIS — E291 Testicular hypofunction: Secondary | ICD-10-CM | POA: Diagnosis not present

## 2016-07-09 DIAGNOSIS — R062 Wheezing: Secondary | ICD-10-CM | POA: Diagnosis not present

## 2016-07-09 MED FILL — NAPROXEN 500 MG TABLET: 500 | 30 days supply | Qty: 60 | Fill #0

## 2016-07-09 MED FILL — CYCLOBENZAPRINE 10 MG TABLE: 10 | 10 days supply | Qty: 10 | Fill #0

## 2016-07-09 MED FILL — VENTOLIN HFA 90 MCG INHALER: 108 (90 BAS | 25 days supply | Qty: 18 | Fill #0

## 2016-07-23 DIAGNOSIS — E291 Testicular hypofunction: Secondary | ICD-10-CM | POA: Diagnosis not present

## 2016-08-06 DIAGNOSIS — E291 Testicular hypofunction: Secondary | ICD-10-CM | POA: Diagnosis not present

## 2016-08-06 MED FILL — SILDENAFIL 20 MG TABLET: 20 | 10 days supply | Qty: 50 | Fill #0

## 2016-08-20 DIAGNOSIS — E291 Testicular hypofunction: Secondary | ICD-10-CM | POA: Diagnosis not present

## 2016-08-21 MED FILL — ZOLPIDEM TARTRATE 10 MG TAB: 10 | 30 days supply | Qty: 30 | Fill #0 | Status: TO

## 2016-09-03 DIAGNOSIS — E291 Testicular hypofunction: Secondary | ICD-10-CM | POA: Diagnosis not present

## 2016-09-15 MED FILL — ATENOLOL 25 MG TABLET: 25 | 30 days supply | Qty: 30 | Fill #0 | Status: TO

## 2016-09-15 MED FILL — ALPRAZolam 0.5 MG TABS: 0.5 | 90 days supply | Qty: 90 | Fill #0

## 2016-09-17 DIAGNOSIS — E291 Testicular hypofunction: Secondary | ICD-10-CM | POA: Diagnosis not present

## 2016-10-02 DIAGNOSIS — E291 Testicular hypofunction: Secondary | ICD-10-CM | POA: Diagnosis not present

## 2016-10-02 DIAGNOSIS — Z23 Encounter for immunization: Secondary | ICD-10-CM | POA: Diagnosis not present

## 2016-10-12 MED FILL — ATENOLOL 25 MG TABLET: 25 | 90 days supply | Qty: 90 | Fill #0

## 2016-10-15 DIAGNOSIS — F418 Other specified anxiety disorders: Secondary | ICD-10-CM | POA: Diagnosis not present

## 2016-10-15 DIAGNOSIS — F4321 Adjustment disorder with depressed mood: Secondary | ICD-10-CM | POA: Diagnosis not present

## 2016-10-22 DIAGNOSIS — E291 Testicular hypofunction: Secondary | ICD-10-CM | POA: Diagnosis not present

## 2016-10-22 MED FILL — SILDENAFIL 20 MG TABLET: 20 | 10 days supply | Qty: 50 | Fill #1 | Status: TO

## 2016-11-02 MED FILL — ZOLPIDEM TARTRATE 10 MG TAB: 10 | 30 days supply | Qty: 30 | Fill #0 | Status: TO

## 2016-11-02 MED FILL — ALPRAZolam 0.5 MG TABS: 0.5 | 90 days supply | Qty: 180 | Fill #0

## 2016-11-05 DIAGNOSIS — N529 Male erectile dysfunction, unspecified: Secondary | ICD-10-CM | POA: Diagnosis not present

## 2016-11-05 DIAGNOSIS — N486 Induration penis plastica: Secondary | ICD-10-CM | POA: Diagnosis not present

## 2016-11-06 DIAGNOSIS — I472 Ventricular tachycardia: Secondary | ICD-10-CM | POA: Diagnosis not present

## 2016-11-06 DIAGNOSIS — F4321 Adjustment disorder with depressed mood: Secondary | ICD-10-CM | POA: Diagnosis not present

## 2016-11-06 DIAGNOSIS — F418 Other specified anxiety disorders: Secondary | ICD-10-CM | POA: Diagnosis not present

## 2016-11-06 DIAGNOSIS — E291 Testicular hypofunction: Secondary | ICD-10-CM | POA: Diagnosis not present

## 2016-11-10 MED FILL — SHINGRIX VIAL KIT: 50 | 1 days supply | Qty: 1 | Fill #0

## 2016-11-13 ENCOUNTER — Telehealth: Payer: Self-pay

## 2016-11-13 DIAGNOSIS — H5203 Hypermetropia, bilateral: Secondary | ICD-10-CM | POA: Diagnosis not present

## 2016-11-13 NOTE — Telephone Encounter (Signed)
Received FYI records form Urologist regarding patients Atenolol dosage and erectile dysfunction. Patient has not been seen by Dr. Caryl Comes since 2006, so per Dr. Olin Pia request all medication decision should be deferred to the patient PCP. I have called to update Dr Terlecki's office and they are agreeable.

## 2016-11-25 DIAGNOSIS — E291 Testicular hypofunction: Secondary | ICD-10-CM | POA: Diagnosis not present

## 2016-12-15 MED FILL — ZOLPIDEM TARTRATE 10 MG TAB: 10 | 30 days supply | Qty: 30 | Fill #0 | Status: TO

## 2016-12-15 MED FILL — SILDENAFIL 20 MG TABLET: 20 | 10 days supply | Qty: 50 | Fill #0 | Status: TO

## 2016-12-18 DIAGNOSIS — E291 Testicular hypofunction: Secondary | ICD-10-CM | POA: Diagnosis not present

## 2016-12-31 MED FILL — SILDENAFIL 20 MG TABLET: 20 | 10 days supply | Qty: 50 | Fill #1 | Status: TO

## 2017-01-08 DIAGNOSIS — E039 Hypothyroidism, unspecified: Secondary | ICD-10-CM | POA: Diagnosis not present

## 2017-01-08 DIAGNOSIS — D171 Benign lipomatous neoplasm of skin and subcutaneous tissue of trunk: Secondary | ICD-10-CM | POA: Diagnosis not present

## 2017-01-08 DIAGNOSIS — E7800 Pure hypercholesterolemia, unspecified: Secondary | ICD-10-CM | POA: Diagnosis not present

## 2017-01-08 DIAGNOSIS — I471 Supraventricular tachycardia: Secondary | ICD-10-CM | POA: Diagnosis not present

## 2017-01-08 DIAGNOSIS — R5383 Other fatigue: Secondary | ICD-10-CM | POA: Diagnosis not present

## 2017-01-08 DIAGNOSIS — Z125 Encounter for screening for malignant neoplasm of prostate: Secondary | ICD-10-CM | POA: Diagnosis not present

## 2017-01-08 DIAGNOSIS — F419 Anxiety disorder, unspecified: Secondary | ICD-10-CM | POA: Diagnosis not present

## 2017-01-08 DIAGNOSIS — E559 Vitamin D deficiency, unspecified: Secondary | ICD-10-CM | POA: Diagnosis not present

## 2017-01-08 DIAGNOSIS — R490 Dysphonia: Secondary | ICD-10-CM | POA: Diagnosis not present

## 2017-01-08 DIAGNOSIS — E291 Testicular hypofunction: Secondary | ICD-10-CM | POA: Diagnosis not present

## 2017-01-18 DIAGNOSIS — R739 Hyperglycemia, unspecified: Secondary | ICD-10-CM | POA: Diagnosis not present

## 2017-01-18 DIAGNOSIS — K219 Gastro-esophageal reflux disease without esophagitis: Secondary | ICD-10-CM | POA: Diagnosis not present

## 2017-01-27 DIAGNOSIS — E291 Testicular hypofunction: Secondary | ICD-10-CM | POA: Diagnosis not present

## 2017-02-09 MED FILL — SHINGRIX 50 MCG SUS: 50 | 1 days supply | Qty: 1 | Fill #1

## 2017-02-10 DIAGNOSIS — E291 Testicular hypofunction: Secondary | ICD-10-CM | POA: Diagnosis not present

## 2017-02-24 DIAGNOSIS — E291 Testicular hypofunction: Secondary | ICD-10-CM | POA: Diagnosis not present

## 2017-03-09 MED FILL — ALPRAZolam 0.5 MG TABS: 0.5 | 90 days supply | Qty: 180 | Fill #0

## 2017-03-15 DIAGNOSIS — E291 Testicular hypofunction: Secondary | ICD-10-CM | POA: Diagnosis not present

## 2017-03-16 DIAGNOSIS — E291 Testicular hypofunction: Secondary | ICD-10-CM | POA: Diagnosis not present

## 2017-03-16 DIAGNOSIS — N5201 Erectile dysfunction due to arterial insufficiency: Secondary | ICD-10-CM | POA: Diagnosis not present

## 2017-03-16 DIAGNOSIS — N486 Induration penis plastica: Secondary | ICD-10-CM | POA: Diagnosis not present

## 2017-03-30 DIAGNOSIS — E291 Testicular hypofunction: Secondary | ICD-10-CM | POA: Diagnosis not present

## 2017-04-12 DIAGNOSIS — E291 Testicular hypofunction: Secondary | ICD-10-CM | POA: Diagnosis not present

## 2017-04-27 DIAGNOSIS — E291 Testicular hypofunction: Secondary | ICD-10-CM | POA: Diagnosis not present

## 2017-05-12 DIAGNOSIS — E291 Testicular hypofunction: Secondary | ICD-10-CM | POA: Diagnosis not present

## 2017-05-25 DIAGNOSIS — E291 Testicular hypofunction: Secondary | ICD-10-CM | POA: Diagnosis not present

## 2017-06-02 DIAGNOSIS — J069 Acute upper respiratory infection, unspecified: Secondary | ICD-10-CM | POA: Diagnosis not present

## 2017-06-02 DIAGNOSIS — B9789 Other viral agents as the cause of diseases classified elsewhere: Secondary | ICD-10-CM | POA: Diagnosis not present

## 2017-06-22 DIAGNOSIS — E291 Testicular hypofunction: Secondary | ICD-10-CM | POA: Diagnosis not present

## 2017-06-22 DIAGNOSIS — R7303 Prediabetes: Secondary | ICD-10-CM | POA: Diagnosis not present

## 2017-07-06 DIAGNOSIS — R739 Hyperglycemia, unspecified: Secondary | ICD-10-CM | POA: Diagnosis not present

## 2017-07-06 DIAGNOSIS — E291 Testicular hypofunction: Secondary | ICD-10-CM | POA: Diagnosis not present

## 2017-07-20 DIAGNOSIS — E291 Testicular hypofunction: Secondary | ICD-10-CM | POA: Diagnosis not present

## 2017-07-26 ENCOUNTER — Encounter: Payer: Self-pay | Admitting: *Deleted

## 2017-08-03 DIAGNOSIS — E291 Testicular hypofunction: Secondary | ICD-10-CM | POA: Diagnosis not present

## 2017-08-03 MED FILL — ALPRAZolam 0.5 MG TABS: 0.5 | 90 days supply | Qty: 180 | Fill #0

## 2017-08-03 MED FILL — ZOLPIDEM TARTRATE 10 MG TAB: 10 | 30 days supply | Qty: 30 | Fill #0

## 2017-08-13 ENCOUNTER — Encounter: Payer: Self-pay | Admitting: Internal Medicine

## 2017-08-18 DIAGNOSIS — E291 Testicular hypofunction: Secondary | ICD-10-CM | POA: Diagnosis not present

## 2017-08-31 DIAGNOSIS — E291 Testicular hypofunction: Secondary | ICD-10-CM | POA: Diagnosis not present

## 2017-09-14 DIAGNOSIS — E291 Testicular hypofunction: Secondary | ICD-10-CM | POA: Diagnosis not present

## 2017-09-28 DIAGNOSIS — E291 Testicular hypofunction: Secondary | ICD-10-CM | POA: Diagnosis not present

## 2017-10-12 DIAGNOSIS — Z23 Encounter for immunization: Secondary | ICD-10-CM | POA: Diagnosis not present

## 2017-10-12 DIAGNOSIS — E291 Testicular hypofunction: Secondary | ICD-10-CM | POA: Diagnosis not present

## 2017-10-21 MED FILL — ZOLPIDEM TARTRATE 10 MG TAB: 10 | 30 days supply | Qty: 30 | Fill #1

## 2017-10-27 DIAGNOSIS — E291 Testicular hypofunction: Secondary | ICD-10-CM | POA: Diagnosis not present

## 2017-11-02 ENCOUNTER — Encounter: Payer: Self-pay | Admitting: Internal Medicine

## 2017-11-03 DIAGNOSIS — H43812 Vitreous degeneration, left eye: Secondary | ICD-10-CM | POA: Diagnosis not present

## 2017-11-09 DIAGNOSIS — E291 Testicular hypofunction: Secondary | ICD-10-CM | POA: Diagnosis not present

## 2017-11-23 ENCOUNTER — Ambulatory Visit (AMBULATORY_SURGERY_CENTER): Payer: Self-pay

## 2017-11-23 VITALS — Ht 72.0 in | Wt 182.0 lb

## 2017-11-23 DIAGNOSIS — Z86010 Personal history of colonic polyps: Secondary | ICD-10-CM

## 2017-11-23 DIAGNOSIS — E291 Testicular hypofunction: Secondary | ICD-10-CM | POA: Diagnosis not present

## 2017-11-23 MED ORDER — NA SULFATE-K SULFATE-MG SULF 17.5-3.13-1.6 GM/177ML PO SOLN: 1.0000 | Freq: Once | ORAL | 0 refills | Status: AC

## 2017-11-23 NOTE — Progress Notes (Signed)
Denies allergies to eggs or soy products. Denies complication of anesthesia or sedation. Denies use of weight loss medication. Denies use of O2.   Emmi instructions declined.  

## 2017-11-29 ENCOUNTER — Telehealth: Payer: Self-pay | Admitting: Internal Medicine

## 2017-11-29 MED ORDER — PEG 3350-KCL-NA BICARB-NACL 420 G PO SOLR: 4000.0000 mL | Freq: Once | ORAL | 0 refills | Status: AC

## 2017-11-29 MED FILL — PEG-3350 SOLUTION: 420 | 1 days supply | Qty: 4000 | Fill #0

## 2017-11-29 NOTE — Telephone Encounter (Signed)
Per [facility] pharmacy prep is $90. - [facility]  pharmacy Attempting for cheaper prep  Called pt- [facility] before changing   Lelan Pons PV

## 2017-11-29 NOTE — Telephone Encounter (Signed)
Pt returned call- states he will not pay $90- I explained we can send in Golytely- he was okay with that - I explained I would print new instructions and mail them- I verified his address- pt asked why we use a $90 prep when there is an inexpensive one available= I explained most  people do not want to drink the Gallon of prep and some would rather pay the money and we do not know when a prep is covered or not- he verbalized understanding-  I sent in Golytely to [place] out pt- printed and mailed new instructions

## 2017-11-29 NOTE — Telephone Encounter (Signed)
[facility] outpatient pharmacy calling to state suprep for pt colon on 12.13.19 is too expensive but ins will cover trilyte or gavalyte is a prescription would be sent in.

## 2017-12-01 ENCOUNTER — Encounter: Payer: Self-pay | Admitting: Internal Medicine

## 2017-12-07 DIAGNOSIS — E291 Testicular hypofunction: Secondary | ICD-10-CM | POA: Diagnosis not present

## 2017-12-16 MED FILL — ALPRAZolam 0.5 MG TABS: 0.5 | 90 days supply | Qty: 180 | Fill #1

## 2017-12-17 ENCOUNTER — Ambulatory Visit (AMBULATORY_SURGERY_CENTER): Payer: PPO | Admitting: Internal Medicine

## 2017-12-17 ENCOUNTER — Encounter: Payer: Self-pay | Admitting: Internal Medicine

## 2017-12-17 VITALS — BP 108/57 | HR 64 | Temp 98.7°F | Resp 12 | Ht 72.0 in | Wt 182.0 lb

## 2017-12-17 DIAGNOSIS — F341 Dysthymic disorder: Secondary | ICD-10-CM | POA: Diagnosis not present

## 2017-12-17 DIAGNOSIS — Z86010 Personal history of colonic polyps: Secondary | ICD-10-CM

## 2017-12-17 DIAGNOSIS — D120 Benign neoplasm of cecum: Secondary | ICD-10-CM | POA: Diagnosis not present

## 2017-12-17 MED ORDER — SODIUM CHLORIDE 0.9 % IV SOLN: 500.0000 mL | Freq: Once | INTRAVENOUS | Status: DC

## 2017-12-17 NOTE — Op Note (Signed)
[place] Patient Name: Samuel Turner Procedure Date: 12/17/2017 9:53 AM MRN: [id number] Endoscopist: Jerene Bears , MD Age: 72 Referring MD:  Date of Birth: April 26, 1945 Gender: Male Account #: [id number] Procedure:                Colonoscopy Indications:              Surveillance: Personal history of adenomatous                            polyps on last colonoscopy 5 years ago Medicines:                Monitored Anesthesia Care Procedure:                Pre-Anesthesia Assessment:                           - Prior to the procedure, a History and Physical                            was performed, and patient medications and                            allergies were reviewed. The patient's tolerance of                            previous anesthesia was also reviewed. The risks                            and benefits of the procedure and the sedation                            options and risks were discussed with the patient.                            All questions were answered, and informed consent                            was obtained. Prior Anticoagulants: The patient has                            taken no previous anticoagulant or antiplatelet                            agents. ASA Grade Assessment: II - A patient with                            mild systemic disease. After reviewing the risks                            and benefits, the patient was deemed in                            satisfactory condition to undergo the procedure.  After obtaining informed consent, the colonoscope                            was passed under direct vision. Throughout the                            procedure, the patient's blood pressure, pulse, and                            oxygen saturations were monitored continuously. The                            Colonoscope was introduced through the anus and                            advanced to the cecum,  identified by appendiceal                            orifice and ileocecal valve. The colonoscopy was                            performed without difficulty. The patient tolerated                            the procedure well. The quality of the bowel                            preparation was good. The ileocecal valve,                            appendiceal orifice, and rectum were photographed. Scope In: 10:00:58 AM Scope Out: 10:20:28 AM Scope Withdrawal Time: 0 hours 13 minutes 38 seconds  Total Procedure Duration: 0 hours 19 minutes 30 seconds  Findings:                 The digital rectal exam was normal.                           A 4 mm polyp was found in the cecum. The polyp was                            sessile. The polyp was removed with a cold snare.                            Resection and retrieval were complete.                           Internal hemorrhoids were found during                            retroflexion. The hemorrhoids were small.                           The exam was otherwise without abnormality. Complications:  No immediate complications. Estimated Blood Loss:     Estimated blood loss was minimal. Impression:               - One 4 mm polyp in the cecum, removed with a cold                            snare. Resected and retrieved.                           - Small internal hemorrhoids.                           - The examination was otherwise normal. Recommendation:           - Patient has a contact number available for                            emergencies. The signs and symptoms of potential                            delayed complications were discussed with the                            patient. Return to normal activities tomorrow.                            Written discharge instructions were provided to the                            patient.                           - Resume previous diet.                           - Continue present  medications.                           - Await pathology results.                           - Repeat colonoscopy is recommended for                            surveillance. The colonoscopy date will be                            determined after pathology results from today's                            exam become available for review. Jerene Bears, MD 12/17/2017 10:24:20 AM This report has been signed electronically.

## 2017-12-17 NOTE — Progress Notes (Signed)
To recovery, report to RN, VSS. 

## 2017-12-17 NOTE — Patient Instructions (Signed)
Impression/Recommendations:  Polyp handout given to patient. Hemorrhoid handout given to patient.  Resume previous diet. Continue present medications.  Repeat colonoscopy recommended for surveillance.  Date to be determined after pathology results reviewed.  YOU HAD AN ENDOSCOPIC PROCEDURE TODAY AT [facility]:   Refer to the procedure report that was given to you for any specific questions about what was found during the examination.  If the procedure report does not answer your questions, please call your gastroenterologist to clarify.  If you requested that your care partner not be given the details of your procedure findings, then the procedure report has been included in a sealed envelope for you to review at your convenience later.  YOU SHOULD EXPECT: Some feelings of bloating in the abdomen. Passage of more gas than usual.  Walking can help get rid of the air that was put into your GI tract during the procedure and reduce the bloating. If you had a lower endoscopy (such as a colonoscopy or flexible sigmoidoscopy) you may notice spotting of blood in your stool or on the toilet paper. If you underwent a bowel prep for your procedure, you may not have a normal bowel movement for a few days.  Please Note:  You might notice some irritation and congestion in your nose or some drainage.  This is from the oxygen used during your procedure.  There is no need for concern and it should clear up in a day or so.  SYMPTOMS TO REPORT IMMEDIATELY:   Following lower endoscopy (colonoscopy or flexible sigmoidoscopy):  Excessive amounts of blood in the stool  Significant tenderness or worsening of abdominal pains  Swelling of the abdomen that is new, acute  Fever of 100F or higher For urgent or emergent issues, a gastroenterologist can be reached at any hour by calling [phone].   DIET:  We do recommend a small meal at first, but then you may proceed to your regular diet.   Drink plenty of fluids but you should avoid alcoholic beverages for 24 hours.  ACTIVITY:  You should plan to take it easy for the rest of today and you should NOT DRIVE or use heavy machinery until tomorrow (because of the sedation medicines used during the test).    FOLLOW UP: Our staff will call the number listed on your records the next business day following your procedure to check on you and address any questions or concerns that you may have regarding the information given to you following your procedure. If we do not reach you, we will leave a message.  However, if you are feeling well and you are not experiencing any problems, there is no need to return our call.  We will assume that you have returned to your regular daily activities without incident.  If any biopsies were taken you will be contacted by phone or by letter within the next 1-3 weeks.  Please call us at [phone] if you have not heard about the biopsies in 3 weeks.    SIGNATURES/CONFIDENTIALITY: You and/or your care partner have signed paperwork which will be entered into your electronic medical record.  These signatures attest to the fact that that the information above on your After Visit Summary has been reviewed and is understood.  Full responsibility of the confidentiality of this discharge information lies with you and/or your care-partner.

## 2017-12-17 NOTE — Progress Notes (Signed)
Pt's states no medical or surgical changes since previsit or office visit. 

## 2017-12-20 ENCOUNTER — Telehealth: Payer: Self-pay | Admitting: *Deleted

## 2017-12-20 NOTE — Telephone Encounter (Signed)
First attempt, left VM.  

## 2017-12-20 NOTE — Telephone Encounter (Signed)
  Follow up Call-  Call back number 12/17/2017  Post procedure Call Back phone  # [phone]  Permission to leave phone message Yes  Some recent data might be hidden     Patient questions:  Message left to call us if necessary.

## 2017-12-21 DIAGNOSIS — Z125 Encounter for screening for malignant neoplasm of prostate: Secondary | ICD-10-CM | POA: Diagnosis not present

## 2017-12-21 DIAGNOSIS — E291 Testicular hypofunction: Secondary | ICD-10-CM | POA: Diagnosis not present

## 2017-12-23 ENCOUNTER — Encounter: Payer: Self-pay | Admitting: Internal Medicine

## 2018-01-04 DIAGNOSIS — E291 Testicular hypofunction: Secondary | ICD-10-CM | POA: Diagnosis not present

## 2018-01-11 ENCOUNTER — Ambulatory Visit
Admission: RE | Admit: 2018-01-11 | Discharge: 2018-01-11 | Disposition: A | Discharge status: Home / Self Care | Payer: PPO | Source: Ambulatory Visit | Attending: Family Medicine | Admitting: Family Medicine

## 2018-01-11 ENCOUNTER — Other Ambulatory Visit: Payer: Self-pay | Admitting: Family Medicine

## 2018-01-11 DIAGNOSIS — J40 Bronchitis, not specified as acute or chronic: Secondary | ICD-10-CM

## 2018-01-11 DIAGNOSIS — R05 Cough: Secondary | ICD-10-CM | POA: Diagnosis not present

## 2018-01-11 MED FILL — AZITHROMYCIN 250 MG TABLET: 250 | 5 days supply | Qty: 6 | Fill #0

## 2018-01-11 MED FILL — HYDROCODONE-HOMATROPINE SOL: 5-1.5 | 5 days supply | Qty: 100 | Fill #0

## 2018-01-18 DIAGNOSIS — E291 Testicular hypofunction: Secondary | ICD-10-CM | POA: Diagnosis not present

## 2018-02-01 DIAGNOSIS — E039 Hypothyroidism, unspecified: Secondary | ICD-10-CM | POA: Diagnosis not present

## 2018-02-08 MED FILL — ZOLPIDEM TARTRATE 10 MG TAB: 10 | 30 days supply | Qty: 30 | Fill #0

## 2018-02-09 DIAGNOSIS — J189 Pneumonia, unspecified organism: Secondary | ICD-10-CM | POA: Diagnosis not present

## 2018-02-10 ENCOUNTER — Other Ambulatory Visit: Payer: Self-pay | Admitting: Family Medicine

## 2018-02-10 ENCOUNTER — Ambulatory Visit
Admission: RE | Admit: 2018-02-10 | Discharge: 2018-02-10 | Disposition: A | Discharge status: Home / Self Care | Payer: PPO | Source: Ambulatory Visit | Attending: Family Medicine | Admitting: Family Medicine

## 2018-02-10 DIAGNOSIS — J181 Lobar pneumonia, unspecified organism: Principal | ICD-10-CM

## 2018-02-10 DIAGNOSIS — J189 Pneumonia, unspecified organism: Secondary | ICD-10-CM

## 2018-02-15 DIAGNOSIS — E291 Testicular hypofunction: Secondary | ICD-10-CM | POA: Diagnosis not present

## 2018-03-01 DIAGNOSIS — E291 Testicular hypofunction: Secondary | ICD-10-CM | POA: Diagnosis not present

## 2018-03-15 DIAGNOSIS — E291 Testicular hypofunction: Secondary | ICD-10-CM | POA: Diagnosis not present

## 2018-03-18 MED FILL — ZOLPIDEM TARTRATE 10 MG TAB: 10 | 30 days supply | Qty: 30 | Fill #1 | Status: TO

## 2018-03-19 MED FILL — ALPRAZolam 0.5 MG TABS: 0.5 | 90 days supply | Qty: 180 | Fill #0

## 2018-03-23 DIAGNOSIS — I472 Ventricular tachycardia: Secondary | ICD-10-CM | POA: Diagnosis not present

## 2018-03-23 DIAGNOSIS — R7303 Prediabetes: Secondary | ICD-10-CM | POA: Diagnosis not present

## 2018-03-23 DIAGNOSIS — E559 Vitamin D deficiency, unspecified: Secondary | ICD-10-CM | POA: Diagnosis not present

## 2018-03-23 DIAGNOSIS — E291 Testicular hypofunction: Secondary | ICD-10-CM | POA: Diagnosis not present

## 2018-03-23 DIAGNOSIS — E7800 Pure hypercholesterolemia, unspecified: Secondary | ICD-10-CM | POA: Diagnosis not present

## 2018-03-23 DIAGNOSIS — Z0000 Encounter for general adult medical examination without abnormal findings: Secondary | ICD-10-CM | POA: Diagnosis not present

## 2018-03-29 DIAGNOSIS — E291 Testicular hypofunction: Secondary | ICD-10-CM | POA: Diagnosis not present

## 2018-04-28 DIAGNOSIS — E291 Testicular hypofunction: Secondary | ICD-10-CM | POA: Diagnosis not present

## 2018-05-05 MED FILL — ZOLPIDEM TARTRATE 10 MG TAB: 10 | 30 days supply | Qty: 30 | Fill #0 | Status: TO

## 2018-05-11 DIAGNOSIS — E291 Testicular hypofunction: Secondary | ICD-10-CM | POA: Diagnosis not present

## 2018-05-25 DIAGNOSIS — E291 Testicular hypofunction: Secondary | ICD-10-CM | POA: Diagnosis not present

## 2018-06-06 DIAGNOSIS — M25511 Pain in right shoulder: Secondary | ICD-10-CM | POA: Diagnosis not present

## 2018-06-06 DIAGNOSIS — G5601 Carpal tunnel syndrome, right upper limb: Secondary | ICD-10-CM | POA: Diagnosis not present

## 2018-06-06 DIAGNOSIS — M79672 Pain in left foot: Secondary | ICD-10-CM | POA: Diagnosis not present

## 2018-06-08 DIAGNOSIS — N486 Induration penis plastica: Secondary | ICD-10-CM | POA: Diagnosis not present

## 2018-06-08 DIAGNOSIS — N5201 Erectile dysfunction due to arterial insufficiency: Secondary | ICD-10-CM | POA: Diagnosis not present

## 2018-06-08 MED FILL — ZOLPIDEM TARTRATE 10 MG TAB: 10 | 30 days supply | Qty: 30 | Fill #0

## 2018-06-22 DIAGNOSIS — E291 Testicular hypofunction: Secondary | ICD-10-CM | POA: Diagnosis not present

## 2018-07-04 MED FILL — ALPRAZolam 0.5 MG TABS: 0.5 | 90 days supply | Qty: 180 | Fill #0

## 2018-07-06 DIAGNOSIS — E291 Testicular hypofunction: Secondary | ICD-10-CM | POA: Diagnosis not present

## 2018-07-06 MED FILL — ZOLPIDEM TARTRATE 10 MG TAB: 10 | 30 days supply | Qty: 30 | Fill #1

## 2018-07-08 MED FILL — TADALAFIL 5 MG TABS: 5 | 30 days supply | Qty: 30 | Fill #0

## 2018-07-20 DIAGNOSIS — E291 Testicular hypofunction: Secondary | ICD-10-CM | POA: Diagnosis not present

## 2018-07-20 DIAGNOSIS — M25511 Pain in right shoulder: Secondary | ICD-10-CM | POA: Diagnosis not present

## 2018-07-28 DIAGNOSIS — M25511 Pain in right shoulder: Secondary | ICD-10-CM | POA: Diagnosis not present

## 2018-08-01 DIAGNOSIS — M25511 Pain in right shoulder: Secondary | ICD-10-CM | POA: Diagnosis not present

## 2018-08-03 DIAGNOSIS — E291 Testicular hypofunction: Secondary | ICD-10-CM | POA: Diagnosis not present

## 2018-08-17 DIAGNOSIS — E291 Testicular hypofunction: Secondary | ICD-10-CM | POA: Diagnosis not present

## 2018-08-18 MED FILL — ZOLPIDEM TARTRATE 10 MG TAB: 10 | 30 days supply | Qty: 30 | Fill #0

## 2018-08-18 MED FILL — TADALAFIL 5 MG TABS: 5 | 30 days supply | Qty: 30 | Fill #0

## 2018-08-31 DIAGNOSIS — E291 Testicular hypofunction: Secondary | ICD-10-CM | POA: Diagnosis not present

## 2018-09-14 DIAGNOSIS — E291 Testicular hypofunction: Secondary | ICD-10-CM | POA: Diagnosis not present

## 2018-10-05 DIAGNOSIS — I471 Supraventricular tachycardia: Secondary | ICD-10-CM | POA: Diagnosis not present

## 2018-10-05 DIAGNOSIS — Z23 Encounter for immunization: Secondary | ICD-10-CM | POA: Diagnosis not present

## 2018-10-05 DIAGNOSIS — E559 Vitamin D deficiency, unspecified: Secondary | ICD-10-CM | POA: Diagnosis not present

## 2018-10-05 DIAGNOSIS — R7303 Prediabetes: Secondary | ICD-10-CM | POA: Diagnosis not present

## 2018-10-05 DIAGNOSIS — E7800 Pure hypercholesterolemia, unspecified: Secondary | ICD-10-CM | POA: Diagnosis not present

## 2018-10-05 DIAGNOSIS — E291 Testicular hypofunction: Secondary | ICD-10-CM | POA: Diagnosis not present

## 2018-10-05 DIAGNOSIS — G4700 Insomnia, unspecified: Secondary | ICD-10-CM | POA: Diagnosis not present

## 2018-10-12 DIAGNOSIS — E291 Testicular hypofunction: Secondary | ICD-10-CM | POA: Diagnosis not present

## 2018-10-18 MED FILL — ZOLPIDEM TARTRATE 10 MG TAB: 10 | 30 days supply | Qty: 30 | Fill #1

## 2018-10-18 MED FILL — ALPRAZolam 0.5 MG TABS: 0.5 | 90 days supply | Qty: 180 | Fill #0

## 2018-10-21 MED FILL — TADALAFIL 5 MG TABS: 5 | 30 days supply | Qty: 30 | Fill #0

## 2018-10-27 DIAGNOSIS — E291 Testicular hypofunction: Secondary | ICD-10-CM | POA: Diagnosis not present

## 2018-11-09 DIAGNOSIS — E291 Testicular hypofunction: Secondary | ICD-10-CM | POA: Diagnosis not present

## 2018-12-07 DIAGNOSIS — E291 Testicular hypofunction: Secondary | ICD-10-CM | POA: Diagnosis not present

## 2018-12-13 MED FILL — ZOLPIDEM TARTRATE 10 MG TAB: 10 | 30 days supply | Qty: 30 | Fill #2

## 2018-12-21 DIAGNOSIS — E291 Testicular hypofunction: Secondary | ICD-10-CM | POA: Diagnosis not present

## 2019-01-04 DIAGNOSIS — E291 Testicular hypofunction: Secondary | ICD-10-CM | POA: Diagnosis not present

## 2019-01-18 DIAGNOSIS — N5201 Erectile dysfunction due to arterial insufficiency: Secondary | ICD-10-CM | POA: Diagnosis not present

## 2019-01-19 MED FILL — ALPRAZolam 0.5 MG TABS: 0.5 | 90 days supply | Qty: 90 | Fill #0

## 2019-01-19 MED FILL — ZOLPIDEM TARTRATE 10 MG TAB: 10 | 30 days supply | Qty: 30 | Fill #3

## 2019-01-31 DIAGNOSIS — E291 Testicular hypofunction: Secondary | ICD-10-CM | POA: Diagnosis not present

## 2019-02-14 DIAGNOSIS — E291 Testicular hypofunction: Secondary | ICD-10-CM | POA: Diagnosis not present

## 2019-02-28 DIAGNOSIS — E291 Testicular hypofunction: Secondary | ICD-10-CM | POA: Diagnosis not present

## 2019-03-10 MED FILL — ZOLPIDEM TARTRATE 10 MG TAB: 10 | 30 days supply | Qty: 30 | Fill #0

## 2019-03-14 DIAGNOSIS — E291 Testicular hypofunction: Secondary | ICD-10-CM | POA: Diagnosis not present

## 2019-03-28 DIAGNOSIS — E291 Testicular hypofunction: Secondary | ICD-10-CM | POA: Diagnosis not present

## 2019-04-12 DIAGNOSIS — F419 Anxiety disorder, unspecified: Secondary | ICD-10-CM | POA: Diagnosis not present

## 2019-04-12 DIAGNOSIS — G4700 Insomnia, unspecified: Secondary | ICD-10-CM | POA: Diagnosis not present

## 2019-04-12 DIAGNOSIS — E7800 Pure hypercholesterolemia, unspecified: Secondary | ICD-10-CM | POA: Diagnosis not present

## 2019-04-12 DIAGNOSIS — Z0000 Encounter for general adult medical examination without abnormal findings: Secondary | ICD-10-CM | POA: Diagnosis not present

## 2019-04-12 DIAGNOSIS — R490 Dysphonia: Secondary | ICD-10-CM | POA: Diagnosis not present

## 2019-04-12 DIAGNOSIS — Z125 Encounter for screening for malignant neoplasm of prostate: Secondary | ICD-10-CM | POA: Diagnosis not present

## 2019-04-12 DIAGNOSIS — R7303 Prediabetes: Secondary | ICD-10-CM | POA: Diagnosis not present

## 2019-04-12 DIAGNOSIS — Z1159 Encounter for screening for other viral diseases: Secondary | ICD-10-CM | POA: Diagnosis not present

## 2019-04-12 DIAGNOSIS — E291 Testicular hypofunction: Secondary | ICD-10-CM | POA: Diagnosis not present

## 2019-04-13 MED FILL — ALPRAZolam 0.5 MG TABS: 0.5 | 90 days supply | Qty: 180 | Fill #0

## 2019-04-25 DIAGNOSIS — E291 Testicular hypofunction: Secondary | ICD-10-CM | POA: Diagnosis not present

## 2019-05-09 DIAGNOSIS — E291 Testicular hypofunction: Secondary | ICD-10-CM | POA: Diagnosis not present

## 2019-05-10 MED FILL — ZOLPIDEM TARTRATE 10 MG TAB: 10 | 30 days supply | Qty: 30 | Fill #1

## 2019-05-11 ENCOUNTER — Encounter (INDEPENDENT_AMBULATORY_CARE_PROVIDER_SITE_OTHER): Payer: Self-pay | Admitting: Otolaryngology

## 2019-05-11 ENCOUNTER — Ambulatory Visit (INDEPENDENT_AMBULATORY_CARE_PROVIDER_SITE_OTHER): Payer: PPO | Admitting: Otolaryngology

## 2019-05-11 ENCOUNTER — Other Ambulatory Visit: Payer: Self-pay

## 2019-05-11 VITALS — Temp 98.1°F

## 2019-05-11 DIAGNOSIS — K219 Gastro-esophageal reflux disease without esophagitis: Secondary | ICD-10-CM

## 2019-05-11 DIAGNOSIS — R490 Dysphonia: Secondary | ICD-10-CM | POA: Diagnosis not present

## 2019-05-11 MED FILL — OMEPRAZOLE 40 MG CPDR: 40 | 30 days supply | Qty: 30 | Fill #0

## 2019-05-11 NOTE — Progress Notes (Signed)
HPI: Samuel Turner is a 74 y.o. male who presents is referred by Dr. Kenton Kingfisher for evaluation of hoarseness.  He is also had problems with some chronic throat clearing and occasional cough.  He used to sing regularly in church and has some difficulty seeing.  Denies any sore throat.  Denies any heartburn or symptomatic reflux symptoms.  No difficulty swallowing or sore throat. He does not smoke.  Sometimes he feels like his throat "tightens up".  He also tends to clear his throat a lot..  Past Medical History:  Diagnosis Date  . Arthritis   . Depression    Situational   . GERD (gastroesophageal reflux disease)   . H. pylori infection   . Tubular adenoma 10/28/2007  . Ventricular tachycardia ([place])    6 yrs ago, no issues now   Past Surgical History:  Procedure Laterality Date  . COLONOSCOPY    . LEFT HEART CATH  2012  . POLYPECTOMY    . TONSILLECTOMY     Social History   Socioeconomic History  . Marital status: Single    Spouse name: Not on file  . Number of children: 2  . Years of education: Not on file  . Highest education level: Not on file  Occupational History  . Occupation: Contractor: [employer]  Tobacco Use  . Smoking status: Never Smoker  . Smokeless tobacco: Never Used  Substance and Sexual Activity  . Alcohol use: Not Currently  . Drug use: No  . Sexual activity: Not on file  Other Topics Concern  . Not on file  Social History Narrative  . Not on file   Social Determinants of Health   Financial Resource Strain:   . Difficulty of Paying Living Expenses:   Food Insecurity:   . Worried About Charity fundraiser in the Last Year:   . Arboriculturist in the Last Year:   Transportation Needs:   . Film/video editor (Medical):   Marland Kitchen Lack of Transportation (Non-Medical):   Physical Activity:   . Days of Exercise per Week:   . Minutes of Exercise per Session:   Stress:   . Feeling of Stress :   Social Connections:   .  Frequency of Communication with Friends and Family:   . Frequency of Social Gatherings with Friends and Family:   . Attends Religious Services:   . Active Member of Clubs or Organizations:   . Attends Archivist Meetings:   Marland Kitchen Marital Status:    Family History  Problem Relation Age of Onset  . Colon cancer Paternal Grandmother   . Ovarian cancer Mother   . Heart disease Father   . Esophageal cancer Neg Hx   . Stomach cancer Neg Hx   . Rectal cancer Neg Hx    No Known Allergies Prior to Admission medications   Medication Sig Start Date End Date Taking? Authorizing Provider  ALPRAZolam Duanne Moron) 0.5 MG tablet Take 0.5 mg by mouth at bedtime as needed for sleep.   Yes [provider]  aspirin 81 MG chewable tablet Chew 81 mg by mouth daily.   Yes [provider]  cholecalciferol (VITAMIN D) 1000 UNITS tablet Take 1,000 Units by mouth daily.   Yes [provider]  Multiple Vitamin (MULTIVITAMIN) tablet Take 1 tablet by mouth daily.   Yes [provider]  Omega-3 Fatty Acids (FISH OIL) 1000 MG CAPS Take 1 capsule by mouth daily.   Yes  [provider]  zolpidem (AMBIEN) 10 MG tablet Take 5 mg by mouth at bedtime as needed for sleep.   Yes [provider]     Positive ROS: Otherwise negative  All other systems have been reviewed and were otherwise negative with the exception of those mentioned in the HPI and as above.  Physical Exam: Constitutional: Alert, well-appearing, no acute distress.  Patient with essentially normal voice in the office today. Ears: External ears without lesions or tenderness. Ear canals are clear bilaterally with intact, clear TMs.  Nasal: External nose without lesions. Septum midline with minimal rhinitis and clear nasal passages bilaterally. Oral: Lips and gums without lesions. Tongue and palate mucosa without lesions. Posterior oropharynx clear.  Patient is status post tonsillectomy. Fiberoptic  laryngoscopy was performed through the left nostril.  Nasopharynx was clear.  Base of tongue vallecula and epiglottis were normal.  Vocal cords were clear bilaterally with normal vocal cord mobility.  No nodules polyps or vocal cord abnormality noted.  Minimal arytenoid edema.  Piriform sinuses were clear bilaterally. Neck: No palpable adenopathy or masses Respiratory: Breathing comfortably  Skin: No facial/neck lesions or rash noted.  Laryngoscopy  Date/Time: 05/11/2019 1:59 PM Performed by: Rozetta Nunnery, MD Authorized by: Rozetta Nunnery, MD   Consent:    Consent obtained:  Verbal   Consent given by:  Patient Procedure details:    Indications: hoarseness, dysphagia, or aspiration     Medication:  Afrin   Instrument: flexible fiberoptic laryngoscope     Scope location: left nare   Mouth:    Oropharynx: normal     Vallecula: normal     Base of tongue: normal     Epiglottis: normal   Throat:    Pyriform sinus: normal     True vocal cords: normal   Comments:     On fiberoptic laryngoscopy vocal cords were clear bilaterally with normal vocal cord mobility.  Minimal arytenoid edema may be indicative of laryngeal pharyngeal reflux.  But no mucosal abnormalities noted.    Assessment: History of intermittent hoarseness. Some globus symptoms which may be related to laryngeal pharyngeal reflux.  Plan: Reviewed with patient concerning normal laryngeal exam on fiberoptic laryngoscopy. Recommended initially use of omeprazole 40 mg daily before dinner for the next 4 to 6 weeks to see if this helps at all with the symptoms. Also discussed drinking plenty liquids and gave him some samples of Mucinex to try since he complains of mucus buildup in the throat. If he has persistent problems especially with complaints of throat tightening when he sings I would refer him to speech pathology in the future if needed. He will call us back if he has any persistent problems as  needed.   Radene Journey, MD   CC:

## 2019-05-23 DIAGNOSIS — E291 Testicular hypofunction: Secondary | ICD-10-CM | POA: Diagnosis not present

## 2019-06-13 DIAGNOSIS — E291 Testicular hypofunction: Secondary | ICD-10-CM | POA: Diagnosis not present

## 2019-06-22 MED FILL — OMEPRAZOLE 40 MG CPDR: 40 | 30 days supply | Qty: 30 | Fill #1

## 2019-06-22 MED FILL — ZOLPIDEM TARTRATE 10 MG TAB: 10 | 30 days supply | Qty: 30 | Fill #2

## 2019-06-27 DIAGNOSIS — E291 Testicular hypofunction: Secondary | ICD-10-CM | POA: Diagnosis not present

## 2019-07-11 DIAGNOSIS — E291 Testicular hypofunction: Secondary | ICD-10-CM | POA: Diagnosis not present

## 2019-07-20 ENCOUNTER — Other Ambulatory Visit (HOSPITAL_COMMUNITY): Payer: Self-pay | Admitting: Family Medicine

## 2019-07-20 MED FILL — ALPRAZolam 0.5 MG TABS: 0.5 | 90 days supply | Qty: 180 | Fill #1

## 2019-07-21 MED FILL — ZOLPIDEM TARTRATE 10 MG TAB: 10 | 30 days supply | Qty: 30 | Fill #0

## 2019-07-25 DIAGNOSIS — E291 Testicular hypofunction: Secondary | ICD-10-CM | POA: Diagnosis not present

## 2019-08-08 DIAGNOSIS — E291 Testicular hypofunction: Secondary | ICD-10-CM | POA: Diagnosis not present

## 2019-08-09 ENCOUNTER — Other Ambulatory Visit (INDEPENDENT_AMBULATORY_CARE_PROVIDER_SITE_OTHER): Payer: Self-pay

## 2019-08-09 MED ORDER — OMEPRAZOLE 40 MG PO CPDR: 40.0000 mg | DELAYED_RELEASE_CAPSULE | Freq: Every day | ORAL | 1 refills | Status: DC

## 2019-08-09 MED FILL — OMEPRAZOLE 40 MG CPDR: 40 | 30 days supply | Qty: 30 | Fill #0

## 2019-08-22 DIAGNOSIS — E291 Testicular hypofunction: Secondary | ICD-10-CM | POA: Diagnosis not present

## 2019-09-05 DIAGNOSIS — E291 Testicular hypofunction: Secondary | ICD-10-CM | POA: Diagnosis not present

## 2019-09-15 DIAGNOSIS — Z20822 Contact with and (suspected) exposure to covid-19: Secondary | ICD-10-CM | POA: Diagnosis not present

## 2019-09-15 DIAGNOSIS — B349 Viral infection, unspecified: Secondary | ICD-10-CM | POA: Diagnosis not present

## 2019-09-19 DIAGNOSIS — E291 Testicular hypofunction: Secondary | ICD-10-CM | POA: Diagnosis not present

## 2019-10-03 ENCOUNTER — Other Ambulatory Visit: Payer: Self-pay

## 2019-10-03 ENCOUNTER — Ambulatory Visit
Admission: RE | Admit: 2019-10-03 | Discharge: 2019-10-03 | Disposition: A | Discharge status: Home / Self Care | Payer: PPO | Source: Ambulatory Visit | Attending: Physician Assistant | Admitting: Physician Assistant

## 2019-10-03 ENCOUNTER — Other Ambulatory Visit: Payer: Self-pay | Admitting: Physician Assistant

## 2019-10-03 DIAGNOSIS — Z23 Encounter for immunization: Secondary | ICD-10-CM | POA: Diagnosis not present

## 2019-10-03 DIAGNOSIS — R0789 Other chest pain: Secondary | ICD-10-CM | POA: Diagnosis not present

## 2019-10-03 DIAGNOSIS — E291 Testicular hypofunction: Secondary | ICD-10-CM | POA: Diagnosis not present

## 2019-10-03 DIAGNOSIS — R030 Elevated blood-pressure reading, without diagnosis of hypertension: Secondary | ICD-10-CM | POA: Diagnosis not present

## 2019-10-03 DIAGNOSIS — M47814 Spondylosis without myelopathy or radiculopathy, thoracic region: Secondary | ICD-10-CM | POA: Diagnosis not present

## 2019-10-04 MED FILL — OMEPRAZOLE 40 MG CPDR: 40 | 30 days supply | Qty: 30 | Fill #1

## 2019-10-04 MED FILL — ZOLPIDEM TARTRATE 10 MG TAB: 10 | 30 days supply | Qty: 30 | Fill #1

## 2019-10-05 ENCOUNTER — Other Ambulatory Visit (HOSPITAL_COMMUNITY): Payer: Self-pay | Admitting: Family Medicine

## 2019-10-17 DIAGNOSIS — R7309 Other abnormal glucose: Secondary | ICD-10-CM | POA: Diagnosis not present

## 2019-10-17 DIAGNOSIS — F419 Anxiety disorder, unspecified: Secondary | ICD-10-CM | POA: Diagnosis not present

## 2019-10-17 DIAGNOSIS — E291 Testicular hypofunction: Secondary | ICD-10-CM | POA: Diagnosis not present

## 2019-10-17 DIAGNOSIS — R030 Elevated blood-pressure reading, without diagnosis of hypertension: Secondary | ICD-10-CM | POA: Diagnosis not present

## 2019-10-17 DIAGNOSIS — R7303 Prediabetes: Secondary | ICD-10-CM | POA: Diagnosis not present

## 2019-11-07 ENCOUNTER — Other Ambulatory Visit (HOSPITAL_COMMUNITY): Payer: Self-pay | Admitting: Family Medicine

## 2019-11-07 DIAGNOSIS — E291 Testicular hypofunction: Secondary | ICD-10-CM | POA: Diagnosis not present

## 2019-11-07 MED FILL — CYCLOBENZAPRINE HCL 10 MG T: 10 | 30 days supply | Qty: 30 | Fill #0

## 2019-11-09 MED FILL — ZOLPIDEM TARTRATE 10 MG TAB: 10 | 30 days supply | Qty: 30 | Fill #2

## 2019-11-09 MED FILL — ALPRAZolam 0.5 MG TABS: 0.5 | 90 days supply | Qty: 180 | Fill #0

## 2019-11-15 ENCOUNTER — Ambulatory Visit: Payer: PPO | Attending: Internal Medicine

## 2019-11-15 ENCOUNTER — Other Ambulatory Visit (HOSPITAL_COMMUNITY): Payer: Self-pay | Admitting: Internal Medicine

## 2019-11-15 DIAGNOSIS — Z23 Encounter for immunization: Secondary | ICD-10-CM

## 2019-11-15 NOTE — Progress Notes (Signed)
   Covid-19 Vaccination Clinic  Name:  Samuel Turner    MRN: [id number] DOB: Aug 22, 1945  11/15/2019  Mr. Samuel Turner was observed post Covid-19 immunization for 15 minutes without incident. He was provided with Vaccine Information Sheet and instruction to access the V-Safe system.   Mr. Samuel Turner was instructed to call 911 with any severe reactions post vaccine: Marland Kitchen Difficulty breathing  . Swelling of face and throat  . A fast heartbeat  . A bad rash all over body  . Dizziness and weakness

## 2019-11-28 DIAGNOSIS — E291 Testicular hypofunction: Secondary | ICD-10-CM | POA: Diagnosis not present

## 2019-12-01 ENCOUNTER — Other Ambulatory Visit (INDEPENDENT_AMBULATORY_CARE_PROVIDER_SITE_OTHER): Payer: Self-pay | Admitting: Otolaryngology

## 2019-12-04 ENCOUNTER — Other Ambulatory Visit (INDEPENDENT_AMBULATORY_CARE_PROVIDER_SITE_OTHER): Payer: Self-pay | Admitting: Otolaryngology

## 2019-12-04 MED FILL — OMEPRAZOLE 40 MG CPDR: 40 | 30 days supply | Qty: 30 | Fill #0

## 2019-12-11 ENCOUNTER — Other Ambulatory Visit (HOSPITAL_COMMUNITY): Payer: Self-pay | Admitting: Orthopedic Surgery

## 2019-12-11 DIAGNOSIS — M5135 Other intervertebral disc degeneration, thoracolumbar region: Secondary | ICD-10-CM | POA: Diagnosis not present

## 2019-12-11 DIAGNOSIS — R200 Anesthesia of skin: Secondary | ICD-10-CM | POA: Diagnosis not present

## 2019-12-11 MED FILL — MELOXICAM 15 MG TABLET: 15 | 30 days supply | Qty: 30 | Fill #0

## 2019-12-12 DIAGNOSIS — E291 Testicular hypofunction: Secondary | ICD-10-CM | POA: Diagnosis not present

## 2019-12-12 MED FILL — ZOLPIDEM TARTRATE 10 MG TAB: 10 | 30 days supply | Qty: 30 | Fill #3

## 2019-12-26 DIAGNOSIS — E291 Testicular hypofunction: Secondary | ICD-10-CM | POA: Diagnosis not present

## 2020-01-09 DIAGNOSIS — E291 Testicular hypofunction: Secondary | ICD-10-CM | POA: Diagnosis not present

## 2020-01-10 DIAGNOSIS — G5603 Carpal tunnel syndrome, bilateral upper limbs: Secondary | ICD-10-CM | POA: Diagnosis not present

## 2020-01-10 DIAGNOSIS — G5601 Carpal tunnel syndrome, right upper limb: Secondary | ICD-10-CM | POA: Diagnosis not present

## 2020-01-15 MED FILL — OMEPRAZOLE 40 MG CPDR: 40 | 30 days supply | Qty: 30 | Fill #1

## 2020-01-15 MED FILL — ZOLPIDEM TARTRATE 10 MG TAB: 10 | 30 days supply | Qty: 30 | Fill #4

## 2020-01-23 DIAGNOSIS — E291 Testicular hypofunction: Secondary | ICD-10-CM | POA: Diagnosis not present

## 2020-02-06 DIAGNOSIS — E291 Testicular hypofunction: Secondary | ICD-10-CM | POA: Diagnosis not present

## 2020-02-14 ENCOUNTER — Other Ambulatory Visit (INDEPENDENT_AMBULATORY_CARE_PROVIDER_SITE_OTHER): Payer: Self-pay | Admitting: Otolaryngology

## 2020-02-14 MED FILL — ALPRAZolam 0.5 MG TABS: 0.5 | 90 days supply | Qty: 180 | Fill #1

## 2020-02-14 MED FILL — OMEPRAZOLE 40 MG CPDR: 40 | 30 days supply | Qty: 30 | Fill #0

## 2020-02-15 ENCOUNTER — Other Ambulatory Visit (HOSPITAL_COMMUNITY): Payer: Self-pay | Admitting: Family Medicine

## 2020-02-15 MED FILL — ZOLPIDEM TARTRATE 10 MG TAB: 10 | 30 days supply | Qty: 30 | Fill #0

## 2020-03-05 DIAGNOSIS — E291 Testicular hypofunction: Secondary | ICD-10-CM | POA: Diagnosis not present

## 2020-03-19 DIAGNOSIS — E291 Testicular hypofunction: Secondary | ICD-10-CM | POA: Diagnosis not present

## 2020-04-02 DIAGNOSIS — E291 Testicular hypofunction: Secondary | ICD-10-CM | POA: Diagnosis not present

## 2020-04-03 MED FILL — OMEPRAZOLE 40 MG CPDR: 40 | 30 days supply | Qty: 30 | Fill #1

## 2020-04-03 MED FILL — ZOLPIDEM TARTRATE 10 MG TAB: 10 | 30 days supply | Qty: 30 | Fill #1

## 2020-04-16 DIAGNOSIS — E291 Testicular hypofunction: Secondary | ICD-10-CM | POA: Diagnosis not present

## 2020-04-22 DIAGNOSIS — E7800 Pure hypercholesterolemia, unspecified: Secondary | ICD-10-CM | POA: Diagnosis not present

## 2020-04-22 DIAGNOSIS — F419 Anxiety disorder, unspecified: Secondary | ICD-10-CM | POA: Diagnosis not present

## 2020-04-22 DIAGNOSIS — Z0000 Encounter for general adult medical examination without abnormal findings: Secondary | ICD-10-CM | POA: Diagnosis not present

## 2020-04-22 DIAGNOSIS — R7303 Prediabetes: Secondary | ICD-10-CM | POA: Diagnosis not present

## 2020-04-22 DIAGNOSIS — G4700 Insomnia, unspecified: Secondary | ICD-10-CM | POA: Diagnosis not present

## 2020-04-22 DIAGNOSIS — Z125 Encounter for screening for malignant neoplasm of prostate: Secondary | ICD-10-CM | POA: Diagnosis not present

## 2020-04-22 DIAGNOSIS — E291 Testicular hypofunction: Secondary | ICD-10-CM | POA: Diagnosis not present

## 2020-05-07 DIAGNOSIS — E291 Testicular hypofunction: Secondary | ICD-10-CM | POA: Diagnosis not present

## 2020-05-08 ENCOUNTER — Other Ambulatory Visit (HOSPITAL_COMMUNITY): Payer: Self-pay

## 2020-05-28 DIAGNOSIS — E291 Testicular hypofunction: Secondary | ICD-10-CM | POA: Diagnosis not present

## 2020-06-08 DIAGNOSIS — J029 Acute pharyngitis, unspecified: Secondary | ICD-10-CM | POA: Diagnosis not present

## 2020-06-08 DIAGNOSIS — R059 Cough, unspecified: Secondary | ICD-10-CM | POA: Diagnosis not present

## 2020-06-08 DIAGNOSIS — J019 Acute sinusitis, unspecified: Secondary | ICD-10-CM | POA: Diagnosis not present

## 2020-06-09 DIAGNOSIS — R059 Cough, unspecified: Secondary | ICD-10-CM | POA: Diagnosis not present

## 2020-06-09 DIAGNOSIS — Z03818 Encounter for observation for suspected exposure to other biological agents ruled out: Secondary | ICD-10-CM | POA: Diagnosis not present

## 2020-06-09 DIAGNOSIS — J029 Acute pharyngitis, unspecified: Secondary | ICD-10-CM | POA: Diagnosis not present

## 2020-06-18 DIAGNOSIS — E291 Testicular hypofunction: Secondary | ICD-10-CM | POA: Diagnosis not present

## 2020-06-24 ENCOUNTER — Other Ambulatory Visit (HOSPITAL_COMMUNITY): Payer: Self-pay

## 2020-06-24 MED ORDER — ALPRAZOLAM 0.5 MG PO TABS
0.2500 mg | ORAL_TABLET | Freq: Two times a day (BID) | ORAL | 1 refills | Status: DC
  Filled 2020-06-24: qty 180, 90d supply, fill #0
  Filled 2020-10-14: qty 180, 90d supply, fill #1

## 2020-06-24 MED FILL — Zolpidem Tartrate Tab 10 MG: ORAL | 30 days supply | Qty: 30 | Fill #0 | Status: AC

## 2020-07-02 DIAGNOSIS — E291 Testicular hypofunction: Secondary | ICD-10-CM | POA: Diagnosis not present

## 2020-07-11 DIAGNOSIS — S62662A Nondisplaced fracture of distal phalanx of right middle finger, initial encounter for closed fracture: Secondary | ICD-10-CM | POA: Diagnosis not present

## 2020-07-11 IMAGING — CR DG CHEST 2V
2 series · 2 of 2 positions shown · non-contrast
Comparison: 04/08/2003

CLINICAL DATA: Cough with low-grade fever

EXAM:
CHEST - 2 VIEW

[w chest pa]
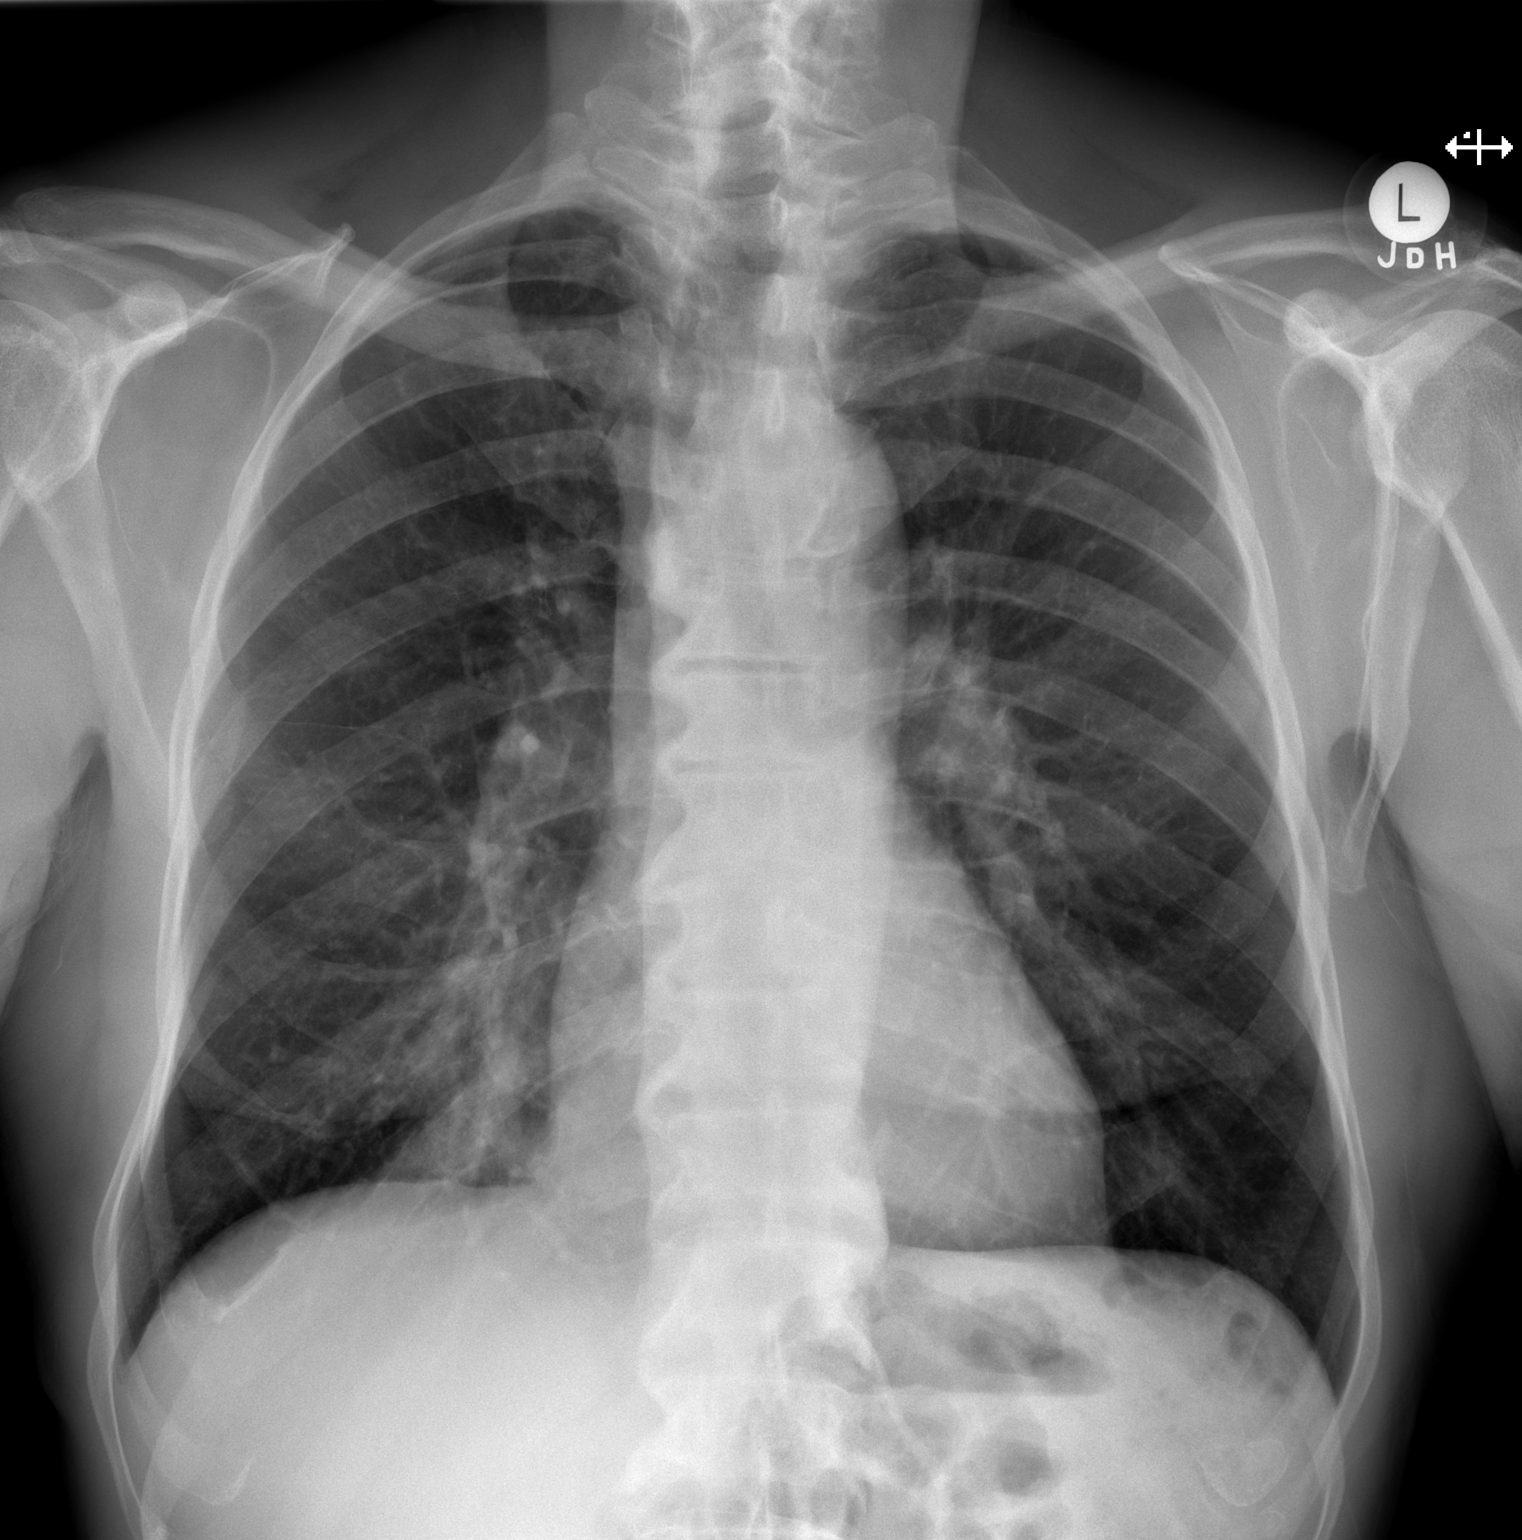

[w chest lat]
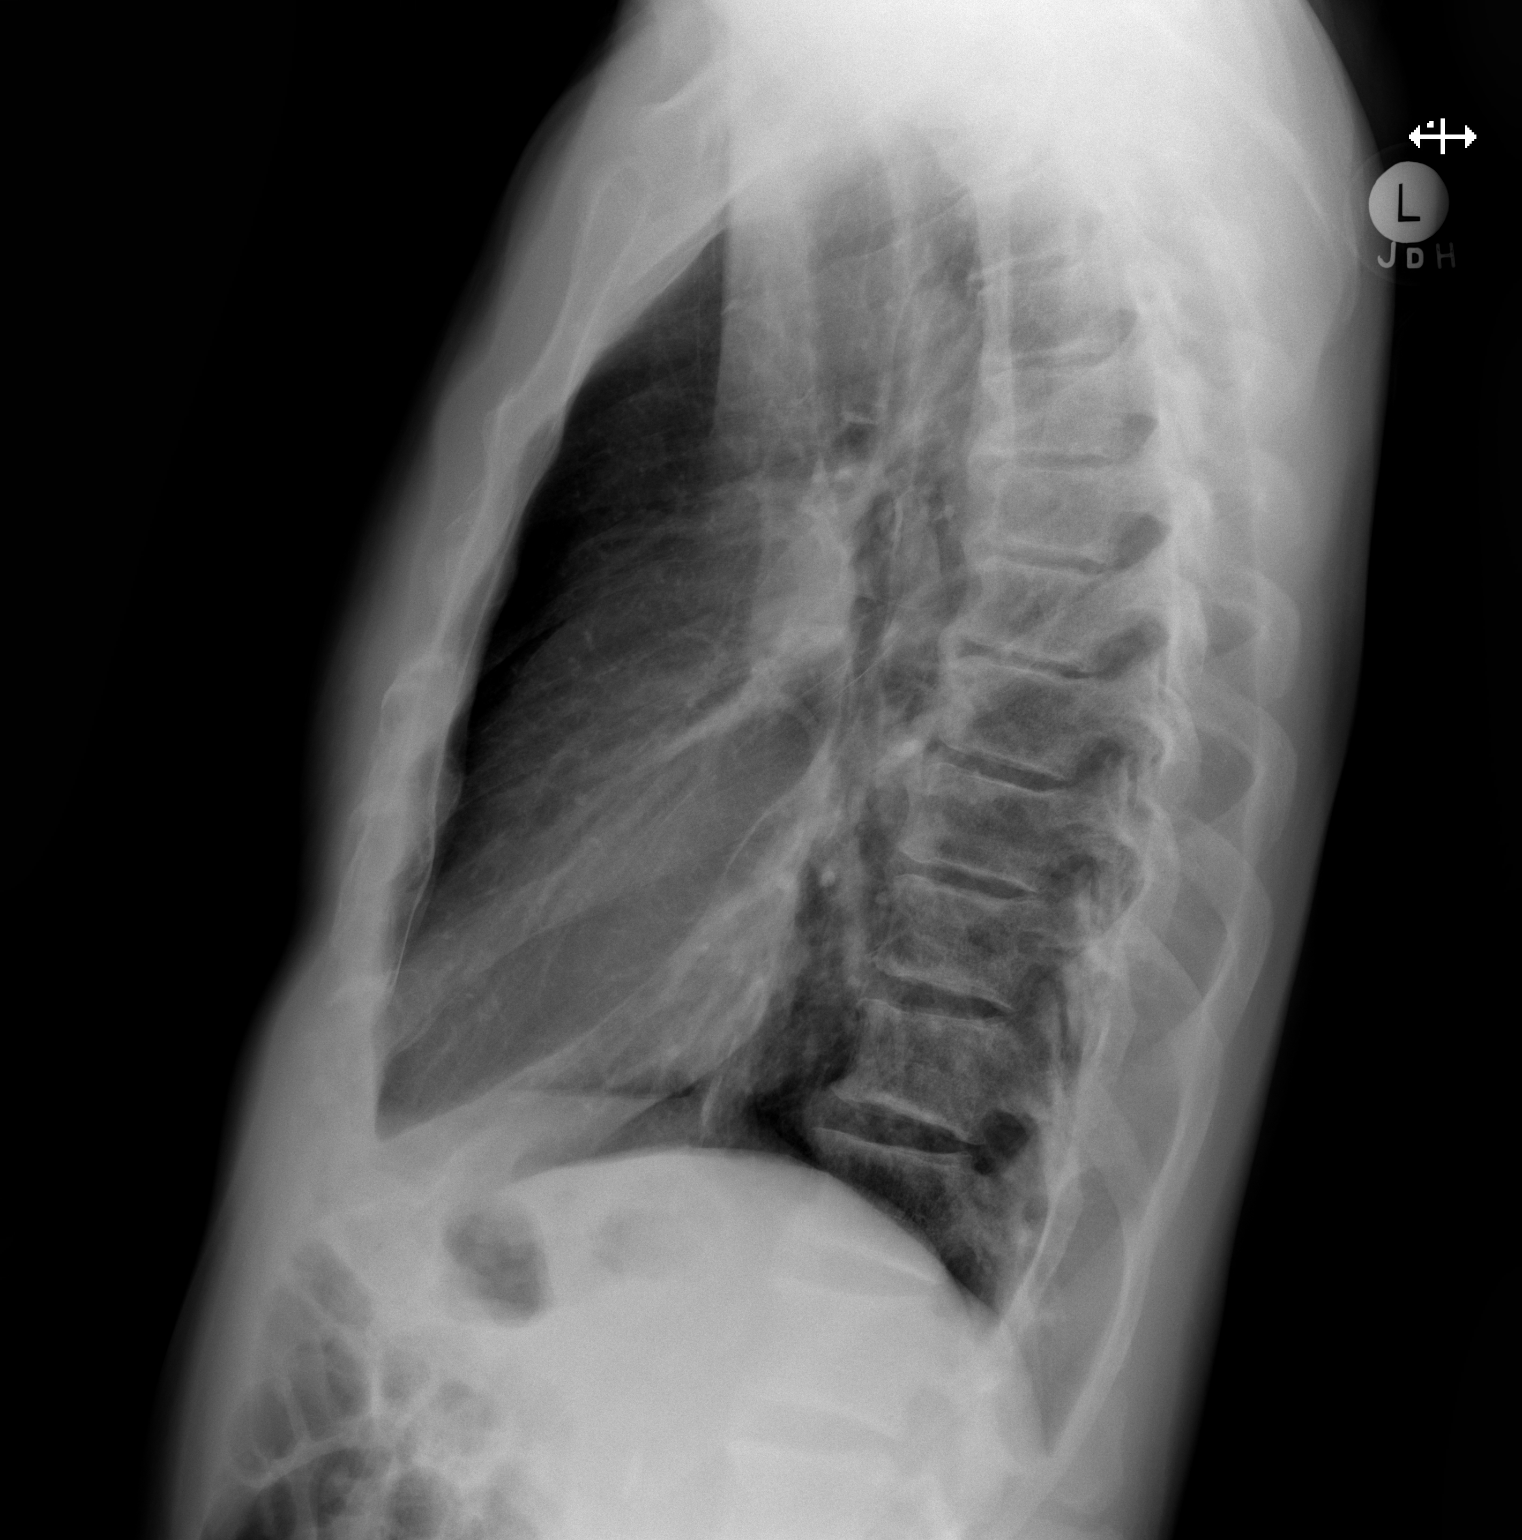

[2 of 2 positions shown; findings below may reference images not displayed]

FINDINGS: Focal airspace disease in the medial right lower lobe suspicious for
a pneumonia. No pleural effusion. Normal heart size. No
pneumothorax.
IMPRESSION: Findings suspicious for right lower lobe pneumonia. Recommend
radiographic follow-up in 4-6 weeks following antibiotic trial to
ensure clearing.

## 2020-07-16 DIAGNOSIS — E291 Testicular hypofunction: Secondary | ICD-10-CM | POA: Diagnosis not present

## 2020-07-25 DIAGNOSIS — Z87891 Personal history of nicotine dependence: Secondary | ICD-10-CM | POA: Diagnosis not present

## 2020-07-25 DIAGNOSIS — F458 Other somatoform disorders: Secondary | ICD-10-CM | POA: Diagnosis not present

## 2020-07-25 DIAGNOSIS — R490 Dysphonia: Secondary | ICD-10-CM | POA: Diagnosis not present

## 2020-07-25 DIAGNOSIS — R0989 Other specified symptoms and signs involving the circulatory and respiratory systems: Secondary | ICD-10-CM | POA: Diagnosis not present

## 2020-07-25 DIAGNOSIS — J392 Other diseases of pharynx: Secondary | ICD-10-CM | POA: Diagnosis not present

## 2020-07-25 DIAGNOSIS — J385 Laryngeal spasm: Secondary | ICD-10-CM | POA: Diagnosis not present

## 2020-07-26 ENCOUNTER — Other Ambulatory Visit (HOSPITAL_BASED_OUTPATIENT_CLINIC_OR_DEPARTMENT_OTHER): Payer: Self-pay

## 2020-07-26 ENCOUNTER — Ambulatory Visit: Payer: PPO | Attending: Internal Medicine

## 2020-07-26 ENCOUNTER — Other Ambulatory Visit: Payer: Self-pay

## 2020-07-26 DIAGNOSIS — Z23 Encounter for immunization: Secondary | ICD-10-CM

## 2020-07-26 MED ORDER — PFIZER-BIONT COVID-19 VAC-TRIS 30 MCG/0.3ML IM SUSP
INTRAMUSCULAR | 0 refills | Status: DC
  Filled 2020-07-26: qty 0.3, 1d supply, fill #0

## 2020-07-26 NOTE — Progress Notes (Signed)
   Covid-19 Vaccination Clinic  Name:  NAMISH FENDT    MRN: [id number] DOB: 1945-04-28  07/26/2020  Mr. Wintjen was observed post Covid-19 immunization for 15 minutes without incident. He was provided with Vaccine Information Sheet and instruction to access the V-Safe system.   Mr. Euresti was instructed to call 911 with any severe reactions post vaccine: Difficulty breathing  Swelling of face and throat  A fast heartbeat  A bad rash all over body  Dizziness and weakness   Immunizations Administered     Name Date Dose VIS Date Route   PFIZER Comrnaty(Gray TOP) Covid-19 Vaccine 07/26/2020  3:46 PM 0.3 mL 12/14/2019 Intramuscular   Manufacturer: [place]   Lot: [id number]   [place]: [phone]

## 2020-07-29 ENCOUNTER — Other Ambulatory Visit (HOSPITAL_COMMUNITY): Payer: Self-pay

## 2020-07-29 DIAGNOSIS — M545 Low back pain, unspecified: Secondary | ICD-10-CM | POA: Diagnosis not present

## 2020-07-29 DIAGNOSIS — S62662A Nondisplaced fracture of distal phalanx of right middle finger, initial encounter for closed fracture: Secondary | ICD-10-CM | POA: Diagnosis not present

## 2020-07-29 MED ORDER — TIZANIDINE HCL 4 MG PO TABS
4.0000 mg | ORAL_TABLET | Freq: Three times a day (TID) | ORAL | 0 refills | Status: DC | PRN
  Filled 2020-07-29: qty 30, 10d supply, fill #0

## 2020-07-29 MED ORDER — MELOXICAM 15 MG PO TABS
15.0000 mg | ORAL_TABLET | Freq: Every day | ORAL | 1 refills | Status: DC
  Filled 2020-07-29: qty 30, 30d supply, fill #0

## 2020-07-29 MED FILL — Zolpidem Tartrate Tab 10 MG: ORAL | 30 days supply | Qty: 30 | Fill #1 | Status: AC

## 2020-07-30 ENCOUNTER — Other Ambulatory Visit (HOSPITAL_COMMUNITY): Payer: Self-pay

## 2020-07-30 DIAGNOSIS — E291 Testicular hypofunction: Secondary | ICD-10-CM | POA: Diagnosis not present

## 2020-07-30 MED ORDER — METHOCARBAMOL 500 MG PO TABS
500.0000 mg | ORAL_TABLET | Freq: Three times a day (TID) | ORAL | 1 refills | Status: DC | PRN
  Filled 2020-07-30 – 2021-05-07 (×2): qty 30, 10d supply, fill #0

## 2020-08-06 DIAGNOSIS — E291 Testicular hypofunction: Secondary | ICD-10-CM | POA: Diagnosis not present

## 2020-08-07 ENCOUNTER — Other Ambulatory Visit (HOSPITAL_COMMUNITY): Payer: Self-pay

## 2020-08-10 IMAGING — CR DG CHEST 2V
2 series · 2 of 2 positions shown · non-contrast
Comparison: 01/11/2018

CLINICAL DATA: Pneumonia right lower lobe

EXAM:
CHEST - 2 VIEW

[w chest pa]
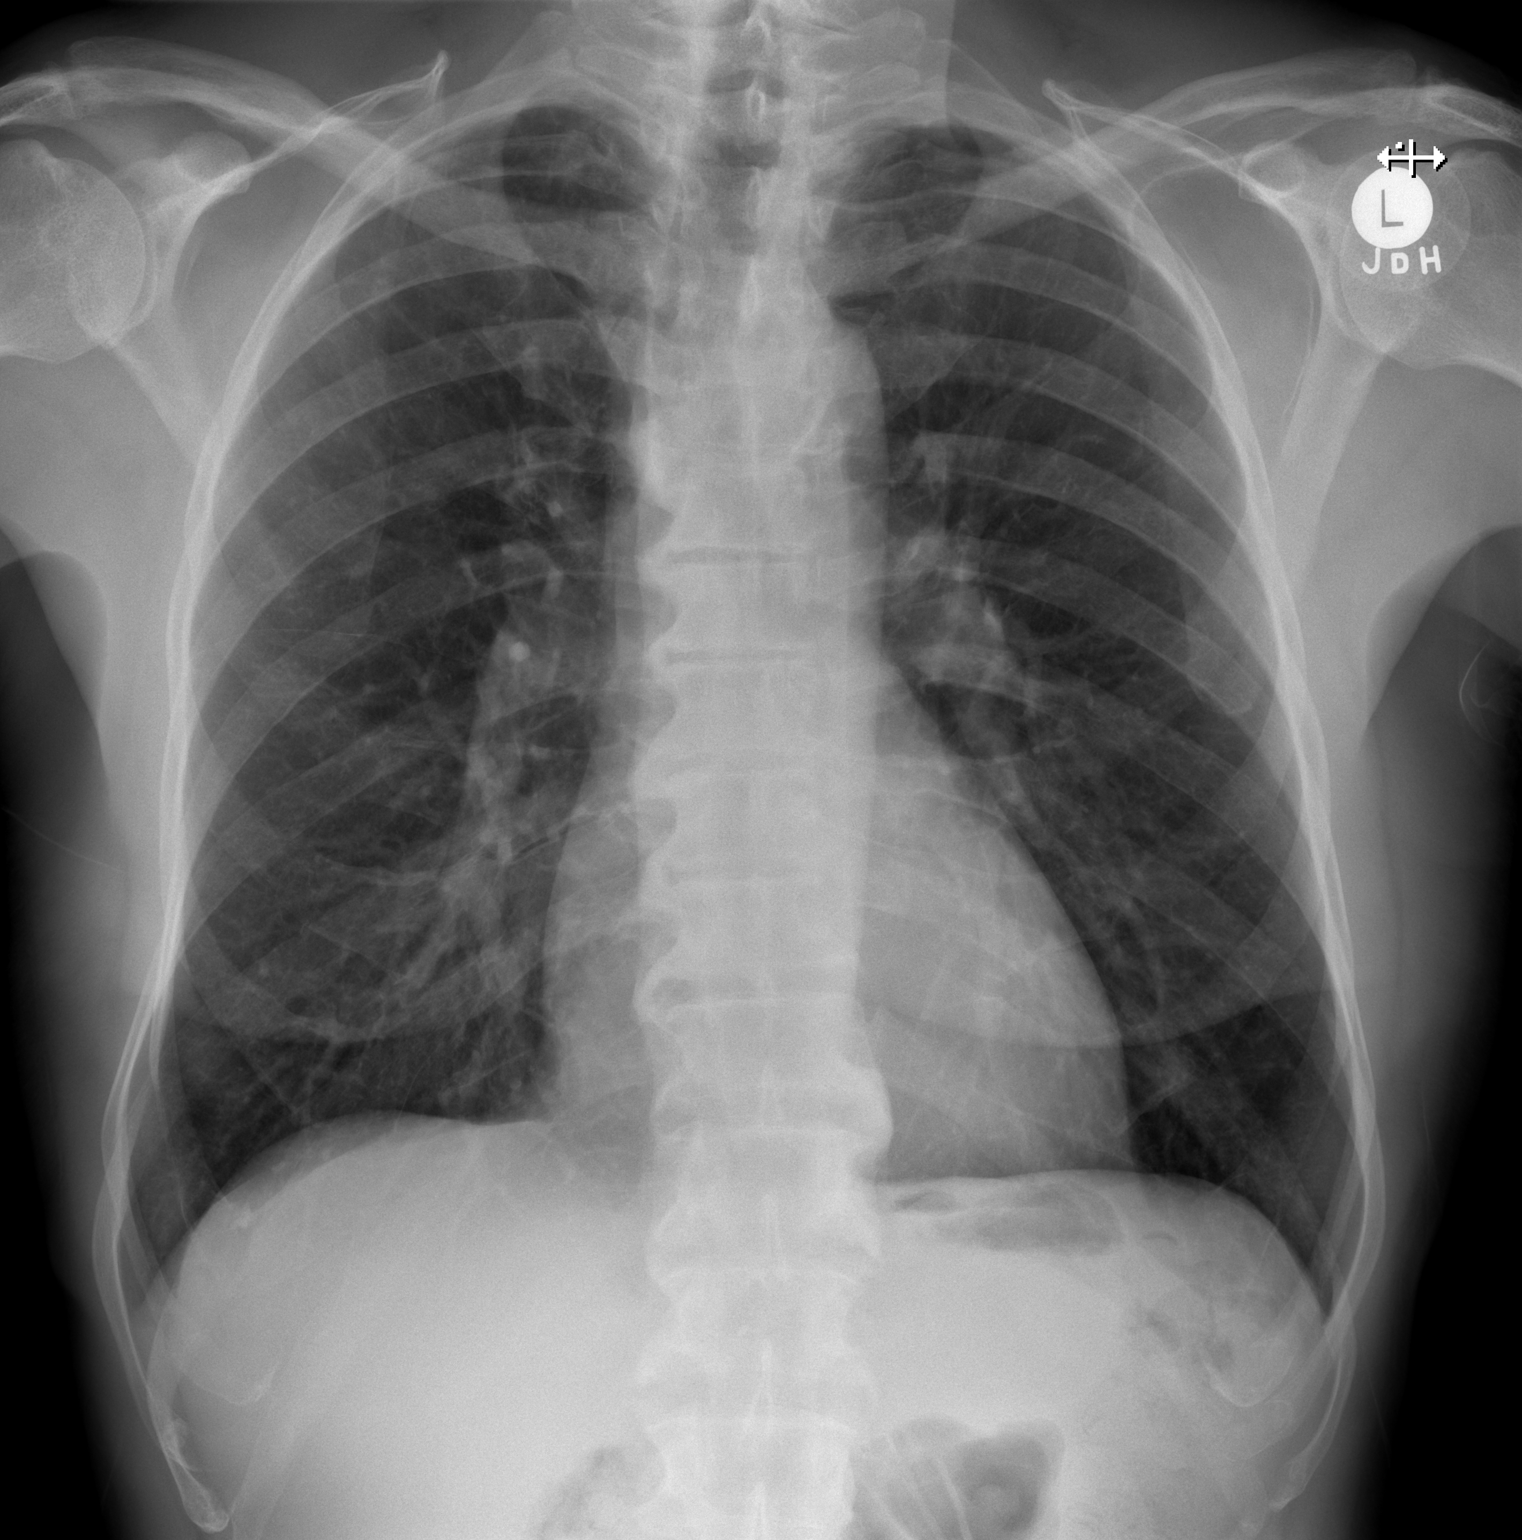

[w chest lat]
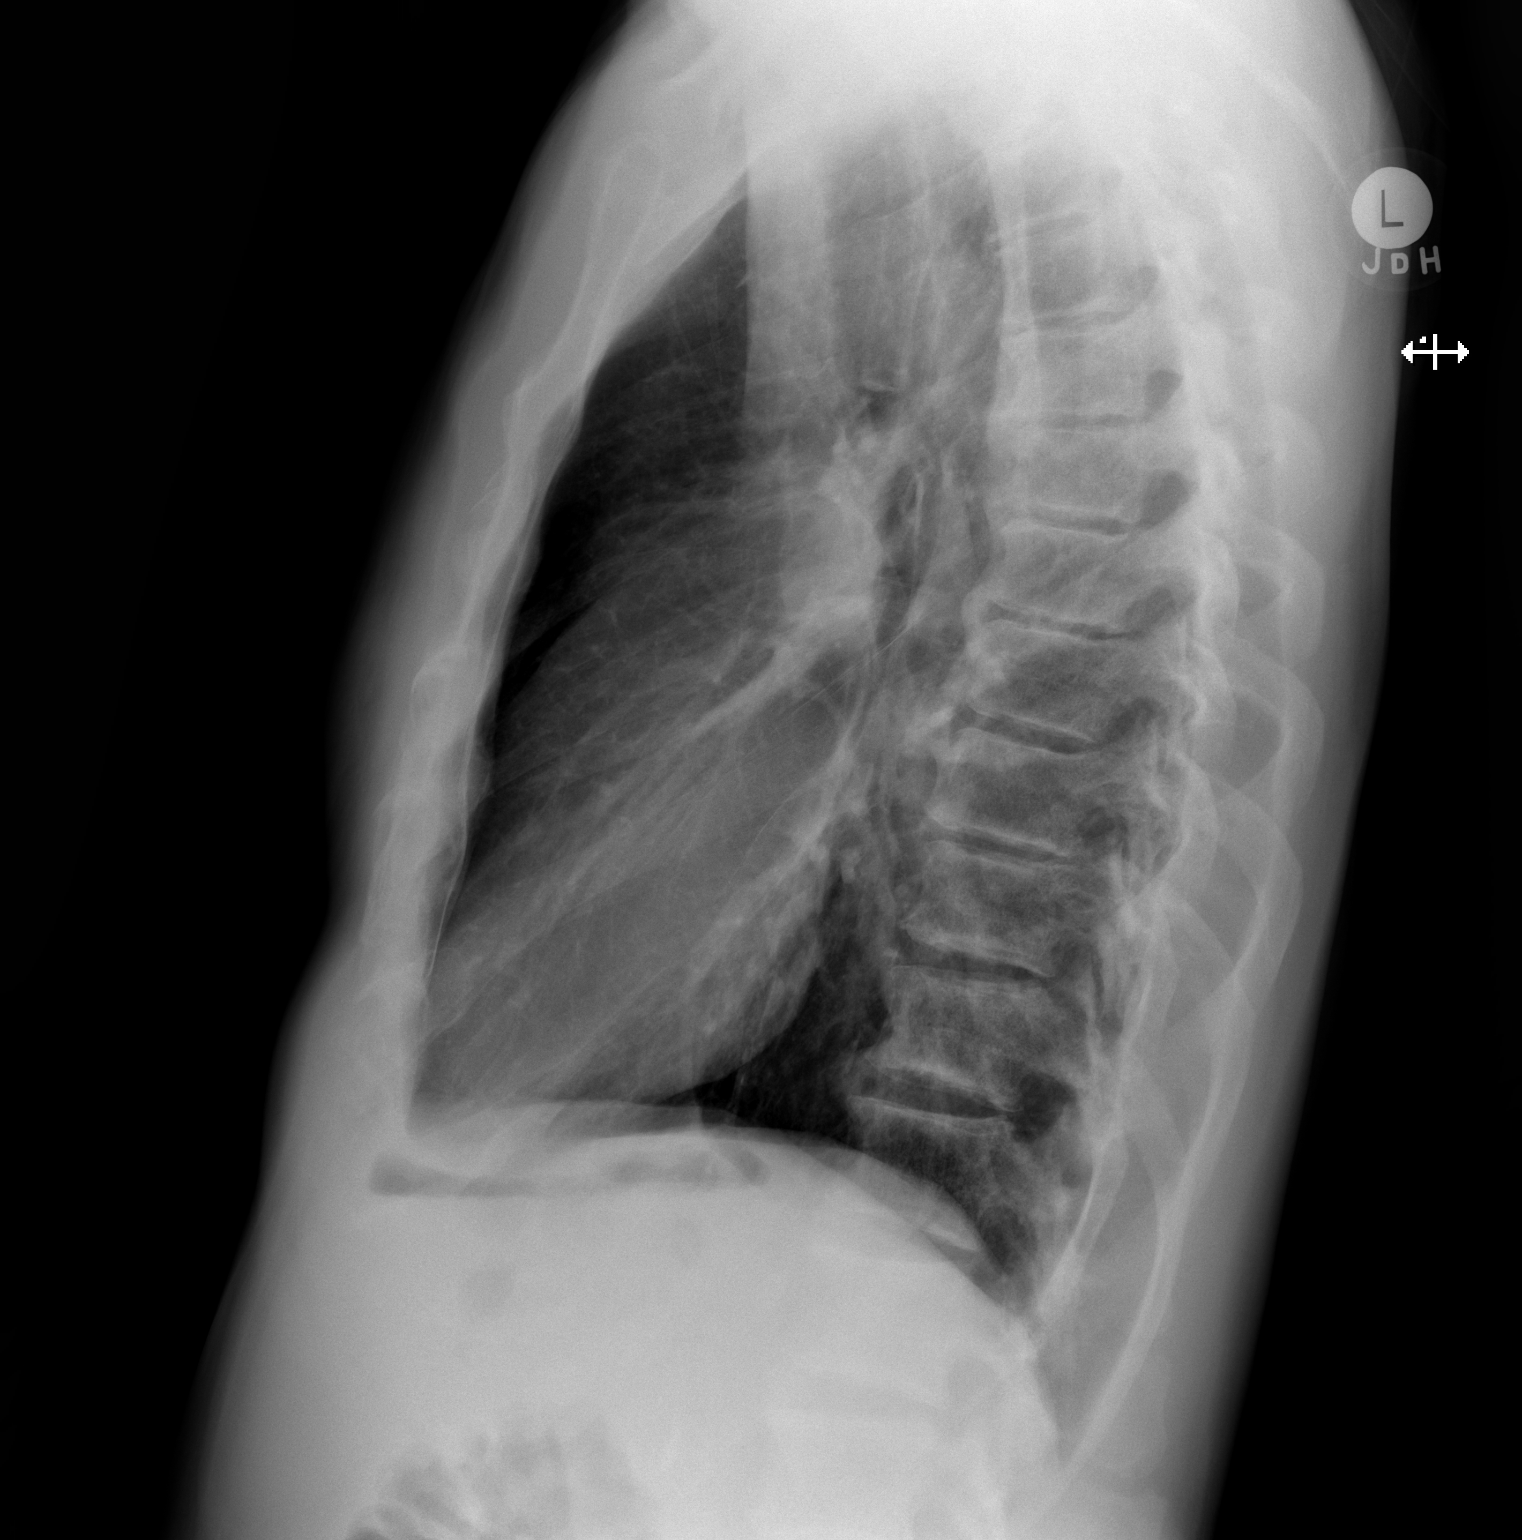

[2 of 2 positions shown; findings below may reference images not displayed]

FINDINGS: Interval clearing of right lower lobe infiltrate. No residual
infiltrate or mass lesion identified

Heart size and vascularity normal. Lungs are now clear without
infiltrate or effusion.
IMPRESSION: No active cardiopulmonary disease. Interval clearing of right lower
lobe pneumonia

## 2020-08-14 DIAGNOSIS — E291 Testicular hypofunction: Secondary | ICD-10-CM | POA: Diagnosis not present

## 2020-08-19 ENCOUNTER — Other Ambulatory Visit (HOSPITAL_COMMUNITY): Payer: Self-pay

## 2020-08-19 DIAGNOSIS — J392 Other diseases of pharynx: Secondary | ICD-10-CM | POA: Diagnosis not present

## 2020-08-28 DIAGNOSIS — E291 Testicular hypofunction: Secondary | ICD-10-CM | POA: Diagnosis not present

## 2020-09-10 DIAGNOSIS — E291 Testicular hypofunction: Secondary | ICD-10-CM | POA: Diagnosis not present

## 2020-09-11 ENCOUNTER — Other Ambulatory Visit (HOSPITAL_COMMUNITY): Payer: Self-pay

## 2020-09-11 MED ORDER — ZOLPIDEM TARTRATE 10 MG PO TABS
10.0000 mg | ORAL_TABLET | Freq: Every day | ORAL | 3 refills | Status: DC | PRN
  Filled 2020-09-11 – 2020-09-12 (×2): qty 30, 30d supply, fill #0
  Filled 2020-10-14: qty 30, 30d supply, fill #1
  Filled 2020-11-13: qty 30, 30d supply, fill #2
  Filled 2020-12-25: qty 30, 30d supply, fill #3

## 2020-09-12 ENCOUNTER — Other Ambulatory Visit (HOSPITAL_COMMUNITY): Payer: Self-pay

## 2020-09-17 ENCOUNTER — Other Ambulatory Visit (HOSPITAL_COMMUNITY): Payer: Self-pay

## 2020-09-30 ENCOUNTER — Other Ambulatory Visit (HOSPITAL_COMMUNITY): Payer: Self-pay

## 2020-10-03 ENCOUNTER — Ambulatory Visit: Payer: PPO | Attending: Internal Medicine

## 2020-10-03 ENCOUNTER — Other Ambulatory Visit (HOSPITAL_BASED_OUTPATIENT_CLINIC_OR_DEPARTMENT_OTHER): Payer: Self-pay

## 2020-10-03 DIAGNOSIS — Z23 Encounter for immunization: Secondary | ICD-10-CM

## 2020-10-03 MED ORDER — PFIZER COVID-19 VAC BIVALENT 30 MCG/0.3ML IM SUSP
INTRAMUSCULAR | 0 refills | Status: DC
  Filled 2020-10-03: qty 0.3, 1d supply, fill #0

## 2020-10-03 MED ORDER — INFLUENZA VAC A&B SA ADJ QUAD 0.5 ML IM PRSY
PREFILLED_SYRINGE | INTRAMUSCULAR | 0 refills | Status: DC
  Filled 2020-10-03: qty 0.5, 1d supply, fill #0

## 2020-10-03 NOTE — Progress Notes (Signed)
   Covid-19 Vaccination Clinic  Name:  Samuel Turner    MRN: [id number] DOB: August 18, 1945  10/03/2020  Samuel Turner was observed post Covid-19 immunization for 15 minutes without incident. He was provided with Vaccine Information Sheet and instruction to access the V-Safe system.   Samuel Turner was instructed to call 911 with any severe reactions post vaccine: Difficulty breathing  Swelling of face and throat  A fast heartbeat  A bad rash all over body  Dizziness and weakness

## 2020-10-08 DIAGNOSIS — E291 Testicular hypofunction: Secondary | ICD-10-CM | POA: Diagnosis not present

## 2020-10-14 ENCOUNTER — Other Ambulatory Visit (HOSPITAL_COMMUNITY): Payer: Self-pay

## 2020-10-22 DIAGNOSIS — E291 Testicular hypofunction: Secondary | ICD-10-CM | POA: Diagnosis not present

## 2020-10-28 DIAGNOSIS — R7303 Prediabetes: Secondary | ICD-10-CM | POA: Diagnosis not present

## 2020-10-28 DIAGNOSIS — Z125 Encounter for screening for malignant neoplasm of prostate: Secondary | ICD-10-CM | POA: Diagnosis not present

## 2020-10-28 DIAGNOSIS — E039 Hypothyroidism, unspecified: Secondary | ICD-10-CM | POA: Diagnosis not present

## 2020-10-28 DIAGNOSIS — R5383 Other fatigue: Secondary | ICD-10-CM | POA: Diagnosis not present

## 2020-10-28 DIAGNOSIS — E7800 Pure hypercholesterolemia, unspecified: Secondary | ICD-10-CM | POA: Diagnosis not present

## 2020-10-29 DIAGNOSIS — F458 Other somatoform disorders: Secondary | ICD-10-CM | POA: Diagnosis not present

## 2020-10-29 DIAGNOSIS — R0989 Other specified symptoms and signs involving the circulatory and respiratory systems: Secondary | ICD-10-CM | POA: Diagnosis not present

## 2020-10-29 DIAGNOSIS — J383 Other diseases of vocal cords: Secondary | ICD-10-CM | POA: Diagnosis not present

## 2020-10-29 DIAGNOSIS — Z87891 Personal history of nicotine dependence: Secondary | ICD-10-CM | POA: Diagnosis not present

## 2020-10-29 DIAGNOSIS — J385 Laryngeal spasm: Secondary | ICD-10-CM | POA: Diagnosis not present

## 2020-10-29 DIAGNOSIS — R490 Dysphonia: Secondary | ICD-10-CM | POA: Diagnosis not present

## 2020-11-05 DIAGNOSIS — E291 Testicular hypofunction: Secondary | ICD-10-CM | POA: Diagnosis not present

## 2020-11-08 DIAGNOSIS — J385 Laryngeal spasm: Secondary | ICD-10-CM | POA: Diagnosis not present

## 2020-11-11 ENCOUNTER — Other Ambulatory Visit: Payer: Self-pay | Admitting: Family Medicine

## 2020-11-11 ENCOUNTER — Ambulatory Visit
Admission: RE | Admit: 2020-11-11 | Discharge: 2020-11-11 | Disposition: A | Discharge status: Home / Self Care | Payer: PPO | Source: Ambulatory Visit | Attending: Family Medicine | Admitting: Family Medicine

## 2020-11-11 DIAGNOSIS — R5383 Other fatigue: Secondary | ICD-10-CM | POA: Diagnosis not present

## 2020-11-13 ENCOUNTER — Other Ambulatory Visit (HOSPITAL_COMMUNITY): Payer: Self-pay

## 2020-11-14 DIAGNOSIS — R002 Palpitations: Secondary | ICD-10-CM | POA: Diagnosis not present

## 2020-11-14 DIAGNOSIS — R5383 Other fatigue: Secondary | ICD-10-CM | POA: Diagnosis not present

## 2020-11-20 DIAGNOSIS — E291 Testicular hypofunction: Secondary | ICD-10-CM | POA: Diagnosis not present

## 2020-12-03 DIAGNOSIS — E291 Testicular hypofunction: Secondary | ICD-10-CM | POA: Diagnosis not present

## 2020-12-16 ENCOUNTER — Ambulatory Visit: Payer: PPO | Admitting: Cardiology

## 2020-12-18 ENCOUNTER — Ambulatory Visit (INDEPENDENT_AMBULATORY_CARE_PROVIDER_SITE_OTHER): Payer: PPO | Admitting: Internal Medicine

## 2020-12-18 ENCOUNTER — Other Ambulatory Visit: Payer: Self-pay

## 2020-12-18 ENCOUNTER — Encounter: Payer: Self-pay | Admitting: Internal Medicine

## 2020-12-18 VITALS — BP 150/82 | HR 57 | Ht 72.0 in | Wt 176.8 lb

## 2020-12-18 DIAGNOSIS — R0600 Dyspnea, unspecified: Secondary | ICD-10-CM | POA: Diagnosis not present

## 2020-12-18 DIAGNOSIS — E291 Testicular hypofunction: Secondary | ICD-10-CM | POA: Diagnosis not present

## 2020-12-18 NOTE — Patient Instructions (Signed)
Medication Instructions:  No Changes In Medications at this time.  *If you need a refill on your cardiac medications before your next appointment, please call your pharmacy*  Testing/Procedures: Your physician has requested that you have an echocardiogram. Echocardiography is a painless test that uses sound waves to create images of your heart. It provides your doctor with information about the size and shape of your heart and how well your hearts chambers and valves are working. You may receive an ultrasound enhancing agent through an IV if needed to better visualize your heart during the echo.This procedure takes approximately one hour. There are no restrictions for this procedure. This will take place at the [address], [address].   Follow-Up: At [facility], you and your health needs are our priority.  As part of our continuing mission to provide you with exceptional heart care, we have created designated Provider Care Teams.  These Care Teams include your primary Cardiologist (physician) and Advanced Practice Providers (APPs -  Physician Assistants and Nurse Practitioners) who all work together to provide you with the care you need, when you need it.  We recommend signing up for the patient portal called "MyChart".  Sign up information is provided on this After Visit Summary.  MyChart is used to connect with patients for Virtual Visits (Telemedicine).  Patients are able to view lab/test results, encounter notes, upcoming appointments, etc.  Non-urgent messages can be sent to your provider as well.   To learn more about what you can do with MyChart, go to [email or web address].    Your next appointment:   AS NEEDED   The format for your next appointment:   In Person  Provider:   Janina Mayo, MD

## 2020-12-18 NOTE — Addendum Note (Signed)
Addended by: Dionicio Stall E on: 12/18/2020 02:20 PM   Modules accepted: Orders

## 2020-12-18 NOTE — Progress Notes (Signed)
Cardiology Office Note:    Date:  12/18/2020   ID:  Samuel Turner, DOB 09-27-1945, MRN [id number]  PCP:  Shirline Frees, MD   [place] Providers Cardiologist:  None     Referring MD: Shirline Frees, MD   No chief complaint on file. Fatigue  History of Present Illness:    Samuel Turner is a 75 y.o. male with a hx of  referral for fatigue  He's been having fatigue. Less energy. For about 6 months. Sometimes it feels difficult to take a deep breath. He denies chest pain or pressure. He denies dyspnea on exertion. He went hunting yesterday, and does not feel as much leg strength. He is active. He denies LH, dizziness, no syncope. No orthopnea, PND or LE edema. He smoked until 76 years old. His father had lung CA, CAD s/p CABG. He smoked. Mother had ovarian cancer died 29. He has two sons, 8 grandchildren. All healthy.  In terms of his cardiac hx, he reports PVCs. He had palpitations and LH. He had a heart monitor which showed VT (unknown if NSVT). He saw Dr. Caryl Comes and had a heart cath. He took a BB for a couple of years. No CAD per patient. He was ok to stop it. It's been about 10-12 years ago.  Prefers to not take a statin.   Labs: 10/28/2020 LDL 85 TC 171 A1c 5.7% TSH 1.3 Crt. 1.08   Past Medical History:  Diagnosis Date   Adjustment disorder with depressed mood    Allergic rhinitis    Anxiety    Arthritis    Colon polyps    Depression    Situational    GERD (gastroesophageal reflux disease)    H. pylori infection    Hypercholesterolemia    Hypothyroidism    Insomnia    Prediabetes    Sleep disorder    Squamous cell skin cancer    Testicular hypogonadism    Tubular adenoma 10/28/2007   Ventricular tachycardia    6 yrs ago, no issues now   Vitamin D deficiency     Past Surgical History:  Procedure Laterality Date   COLONOSCOPY     LEFT HEART CATH  2012   POLYPECTOMY     TONSILLECTOMY      Current Medications: No outpatient medications have  been marked as taking for the 12/18/20 encounter (Appointment) with Janina Mayo, MD.   Current Facility-Administered Medications for the 12/18/20 encounter (Appointment) with Janina Mayo, MD  Medication   0.9 %  sodium chloride infusion     Allergies:   Patient has no known allergies.   Social History   Socioeconomic History   Marital status: Single    Spouse name: Not on file   Number of children: 2   Years of education: Not on file   Highest education level: Not on file  Occupational History   Occupation: Profeesional Counselor    Employer: [employer]  Tobacco Use   Smoking status: Never   Smokeless tobacco: Never  Vaping Use   Vaping Use: Never used  Substance and Sexual Activity   Alcohol use: Not Currently   Drug use: No   Sexual activity: Not on file  Other Topics Concern   Not on file  Social History Narrative   Not on file   Social Determinants of Health   Financial Resource Strain: Not on file  Food Insecurity: Not on file  Transportation Needs: Not on file  Physical Activity: Not on  file  Stress: Not on file  Social Connections: Not on file     Family History: The patient's family history includes Colon cancer in his paternal grandmother; Heart disease in his father; Ovarian cancer in his mother. There is no history of Esophageal cancer, Stomach cancer, or Rectal cancer.  ROS:   Please see the history of present illness.     All other systems reviewed and are negative.  EKGs/Labs/Other Studies Reviewed:    The following studies were reviewed today:   EKG:  EKG is  ordered today.  The ekg ordered today demonstrates   Sinus bradycardia HR 57 bpm, LVH  Recent Labs: No results found for requested labs within last 8760 hours.  Recent Lipid Panel No results found for: CHOL, TRIG, HDL, CHOLHDL, VLDL, LDLCALC, LDLDIRECT   Risk Assessment/Calculations:     ASCVD 17.4%  Physical Exam:    VS:   Vitals:   12/18/20 1323  BP: (!)  150/82  Pulse: (!) 57  SpO2: 99%     Wt Readings from Last 3 Encounters:  12/17/17 182 lb (82.6 kg)  11/23/17 182 lb (82.6 kg)  10/21/12 186 lb (84.4 kg)     GEN:  Well nourished, well developed in no acute distress HEENT: Normal NECK: No JVD; No carotid bruits LYMPHATICS: No lymphadenopathy CARDIAC: RRR, no murmurs, rubs, gallops RESPIRATORY:  Clear to auscultation without rales, wheezing or rhonchi  ABDOMEN: Soft, non-tender, non-distended MUSCULOSKELETAL:  No edema; No deformity  SKIN: Warm and dry NEUROLOGIC:  Alert and oriented x 3 PSYCHIATRIC:  Normal affect   ASSESSMENT:    #Fatigue: He reports overall fatigue. He denies anginal symptoms. Unclear if related to cardiac fxn/valve dx. Will plan for a TTE. We discussed statin therapy and he preferred to hold off. A CAC score is reasonable approach to discuss his individual risk. We discussed getting the echo. Can defer to PCP for consideration of CAC scoring.   PLAN:    In order of problems listed above:  TTE Follow up PRN           Medication Adjustments/Labs and Tests Ordered: Current medicines are reviewed at length with the patient today.  Concerns regarding medicines are outlined above.  No orders of the defined types were placed in this encounter.  No orders of the defined types were placed in this encounter.   There are no Patient Instructions on file for this visit.   Signed, Janina Mayo, MD  12/18/2020 9:35 AM    [place]

## 2020-12-20 DIAGNOSIS — J385 Laryngeal spasm: Secondary | ICD-10-CM | POA: Diagnosis not present

## 2020-12-24 NOTE — Addendum Note (Signed)
Addended by: Jacqulynn Cadet on: 12/24/2020 11:59 AM   Modules accepted: Orders

## 2020-12-25 ENCOUNTER — Other Ambulatory Visit (HOSPITAL_COMMUNITY): Payer: Self-pay

## 2020-12-25 MED ORDER — ALPRAZOLAM 0.5 MG PO TABS
0.2500 mg | ORAL_TABLET | Freq: Two times a day (BID) | ORAL | 0 refills | Status: DC
  Filled 2020-12-25 – 2021-01-23 (×2): qty 30, 15d supply, fill #0

## 2020-12-27 ENCOUNTER — Other Ambulatory Visit (HOSPITAL_COMMUNITY): Payer: Self-pay

## 2020-12-27 DIAGNOSIS — L03012 Cellulitis of left finger: Secondary | ICD-10-CM | POA: Diagnosis not present

## 2020-12-27 MED ORDER — CEPHALEXIN 500 MG PO CAPS
ORAL_CAPSULE | ORAL | 0 refills | Status: DC
  Filled 2020-12-27: qty 14, 7d supply, fill #0

## 2020-12-31 DIAGNOSIS — E291 Testicular hypofunction: Secondary | ICD-10-CM | POA: Diagnosis not present

## 2021-01-07 ENCOUNTER — Other Ambulatory Visit: Payer: Self-pay

## 2021-01-07 ENCOUNTER — Ambulatory Visit (HOSPITAL_COMMUNITY): Payer: PPO | Attending: Cardiovascular Disease

## 2021-01-07 DIAGNOSIS — R0600 Dyspnea, unspecified: Secondary | ICD-10-CM | POA: Insufficient documentation

## 2021-01-07 LAB — ECHOCARDIOGRAM COMPLETE
Area-P 1/2: 2.28 cm2
P 1/2 time: 399 msec
S' Lateral: 2.8 cm

## 2021-01-08 ENCOUNTER — Telehealth: Payer: Self-pay | Admitting: Internal Medicine

## 2021-01-08 NOTE — Telephone Encounter (Signed)
Spoke with patient. He would like the results of his Echo.

## 2021-01-08 NOTE — Telephone Encounter (Signed)
Royce is calling requesting his Echo results.

## 2021-01-09 NOTE — Telephone Encounter (Signed)
Called patient, gave ECHO results.  Patient aware he is as needed follow up, however they request to switch to Dr.Turner as the [address] office is closer to them, and if they need someone they would rather go to that office due to location. I advised I would route a message to Dr.Branch and Dr.Turner (once she returns) for provider switch.  Thanks!

## 2021-01-09 NOTE — Telephone Encounter (Signed)
Patient's wife states the patient needs a call back today about the echo results. She states the patient also does not want to go back to the [facility] office and requests to switch to Dr. Radford Pax depending on how long she will be out of the office.

## 2021-01-13 ENCOUNTER — Other Ambulatory Visit (HOSPITAL_COMMUNITY): Payer: Self-pay

## 2021-01-13 ENCOUNTER — Ambulatory Visit: Payer: PPO | Admitting: Cardiovascular Disease

## 2021-01-13 DIAGNOSIS — N5201 Erectile dysfunction due to arterial insufficiency: Secondary | ICD-10-CM | POA: Diagnosis not present

## 2021-01-13 MED ORDER — CABERGOLINE 0.5 MG PO TABS
0.5000 mg | ORAL_TABLET | ORAL | 1 refills | Status: DC
  Filled 2021-01-13: qty 8, 28d supply, fill #0

## 2021-01-14 DIAGNOSIS — E291 Testicular hypofunction: Secondary | ICD-10-CM | POA: Diagnosis not present

## 2021-01-22 DIAGNOSIS — G5603 Carpal tunnel syndrome, bilateral upper limbs: Secondary | ICD-10-CM | POA: Diagnosis not present

## 2021-01-22 NOTE — Telephone Encounter (Signed)
Attempted to call patient, unable to reach- left detailed message (okay per DPR) with recommendations to follow up with Dr. Radford Pax as needed, also advised to call with any questions.

## 2021-01-23 ENCOUNTER — Other Ambulatory Visit (HOSPITAL_COMMUNITY): Payer: Self-pay

## 2021-01-23 MED ORDER — ALPRAZOLAM 0.5 MG PO TABS
0.2500 mg | ORAL_TABLET | Freq: Two times a day (BID) | ORAL | 1 refills | Status: DC
  Filled 2021-01-23 (×2): qty 180, 90d supply, fill #0
  Filled 2021-05-07: qty 180, 90d supply, fill #1

## 2021-01-23 MED ORDER — ZOLPIDEM TARTRATE 10 MG PO TABS
10.0000 mg | ORAL_TABLET | Freq: Every evening | ORAL | 5 refills | Status: DC | PRN
Start: 2021-01-23 — End: 2021-07-30
  Filled 2021-01-23: qty 30, 30d supply, fill #0
  Filled 2021-04-03: qty 30, 30d supply, fill #1
  Filled 2021-05-07: qty 30, 30d supply, fill #2
  Filled 2021-07-04: qty 30, 30d supply, fill #3

## 2021-01-24 ENCOUNTER — Other Ambulatory Visit (HOSPITAL_COMMUNITY): Payer: Self-pay

## 2021-01-28 DIAGNOSIS — R200 Anesthesia of skin: Secondary | ICD-10-CM | POA: Diagnosis not present

## 2021-01-28 DIAGNOSIS — E291 Testicular hypofunction: Secondary | ICD-10-CM | POA: Diagnosis not present

## 2021-02-05 DIAGNOSIS — G5602 Carpal tunnel syndrome, left upper limb: Secondary | ICD-10-CM | POA: Diagnosis not present

## 2021-02-05 DIAGNOSIS — G5601 Carpal tunnel syndrome, right upper limb: Secondary | ICD-10-CM | POA: Diagnosis not present

## 2021-02-10 DIAGNOSIS — G5601 Carpal tunnel syndrome, right upper limb: Secondary | ICD-10-CM | POA: Diagnosis not present

## 2021-02-11 DIAGNOSIS — E291 Testicular hypofunction: Secondary | ICD-10-CM | POA: Diagnosis not present

## 2021-02-14 ENCOUNTER — Other Ambulatory Visit (HOSPITAL_COMMUNITY): Payer: Self-pay

## 2021-02-14 MED ORDER — MELOXICAM 15 MG PO TABS
15.0000 mg | ORAL_TABLET | Freq: Every day | ORAL | 1 refills | Status: DC
  Filled 2021-02-14: qty 30, 30d supply, fill #0

## 2021-02-24 ENCOUNTER — Other Ambulatory Visit (HOSPITAL_COMMUNITY): Payer: Self-pay

## 2021-02-25 ENCOUNTER — Other Ambulatory Visit (HOSPITAL_COMMUNITY): Payer: Self-pay

## 2021-02-25 DIAGNOSIS — G5601 Carpal tunnel syndrome, right upper limb: Secondary | ICD-10-CM | POA: Diagnosis not present

## 2021-02-25 DIAGNOSIS — E291 Testicular hypofunction: Secondary | ICD-10-CM | POA: Diagnosis not present

## 2021-02-25 DIAGNOSIS — G5602 Carpal tunnel syndrome, left upper limb: Secondary | ICD-10-CM | POA: Diagnosis not present

## 2021-02-25 MED ORDER — GABAPENTIN 300 MG PO CAPS
300.0000 mg | ORAL_CAPSULE | ORAL | 0 refills | Status: DC
  Filled 2021-02-25: qty 90, 33d supply, fill #0

## 2021-03-07 ENCOUNTER — Other Ambulatory Visit (HOSPITAL_BASED_OUTPATIENT_CLINIC_OR_DEPARTMENT_OTHER): Payer: Self-pay

## 2021-03-07 DIAGNOSIS — G5601 Carpal tunnel syndrome, right upper limb: Secondary | ICD-10-CM | POA: Diagnosis not present

## 2021-03-07 MED ORDER — OXYCODONE HCL 5 MG PO TABS
ORAL_TABLET | ORAL | 0 refills | Status: DC
  Filled 2021-03-07: qty 10, 3d supply, fill #0

## 2021-03-11 DIAGNOSIS — E291 Testicular hypofunction: Secondary | ICD-10-CM | POA: Diagnosis not present

## 2021-03-25 DIAGNOSIS — H903 Sensorineural hearing loss, bilateral: Secondary | ICD-10-CM | POA: Diagnosis not present

## 2021-03-25 DIAGNOSIS — E291 Testicular hypofunction: Secondary | ICD-10-CM | POA: Diagnosis not present

## 2021-04-03 ENCOUNTER — Other Ambulatory Visit (HOSPITAL_COMMUNITY): Payer: Self-pay

## 2021-04-08 DIAGNOSIS — E291 Testicular hypofunction: Secondary | ICD-10-CM | POA: Diagnosis not present

## 2021-04-10 DIAGNOSIS — H903 Sensorineural hearing loss, bilateral: Secondary | ICD-10-CM | POA: Diagnosis not present

## 2021-04-22 DIAGNOSIS — E291 Testicular hypofunction: Secondary | ICD-10-CM | POA: Diagnosis not present

## 2021-04-22 DIAGNOSIS — R7303 Prediabetes: Secondary | ICD-10-CM | POA: Diagnosis not present

## 2021-04-24 DIAGNOSIS — N486 Induration penis plastica: Secondary | ICD-10-CM | POA: Diagnosis not present

## 2021-04-24 DIAGNOSIS — N5201 Erectile dysfunction due to arterial insufficiency: Secondary | ICD-10-CM | POA: Diagnosis not present

## 2021-05-05 DIAGNOSIS — F5232 Male orgasmic disorder: Secondary | ICD-10-CM | POA: Diagnosis not present

## 2021-05-05 DIAGNOSIS — N486 Induration penis plastica: Secondary | ICD-10-CM | POA: Diagnosis not present

## 2021-05-05 DIAGNOSIS — N5201 Erectile dysfunction due to arterial insufficiency: Secondary | ICD-10-CM | POA: Diagnosis not present

## 2021-05-06 DIAGNOSIS — L989 Disorder of the skin and subcutaneous tissue, unspecified: Secondary | ICD-10-CM | POA: Diagnosis not present

## 2021-05-06 DIAGNOSIS — G4700 Insomnia, unspecified: Secondary | ICD-10-CM | POA: Diagnosis not present

## 2021-05-06 DIAGNOSIS — I471 Supraventricular tachycardia: Secondary | ICD-10-CM | POA: Diagnosis not present

## 2021-05-06 DIAGNOSIS — R5383 Other fatigue: Secondary | ICD-10-CM | POA: Diagnosis not present

## 2021-05-06 DIAGNOSIS — F419 Anxiety disorder, unspecified: Secondary | ICD-10-CM | POA: Diagnosis not present

## 2021-05-06 DIAGNOSIS — Z0000 Encounter for general adult medical examination without abnormal findings: Secondary | ICD-10-CM | POA: Diagnosis not present

## 2021-05-06 DIAGNOSIS — E291 Testicular hypofunction: Secondary | ICD-10-CM | POA: Diagnosis not present

## 2021-05-06 DIAGNOSIS — R7303 Prediabetes: Secondary | ICD-10-CM | POA: Diagnosis not present

## 2021-05-06 DIAGNOSIS — E7800 Pure hypercholesterolemia, unspecified: Secondary | ICD-10-CM | POA: Diagnosis not present

## 2021-05-07 ENCOUNTER — Other Ambulatory Visit (HOSPITAL_COMMUNITY): Payer: Self-pay

## 2021-05-20 DIAGNOSIS — E291 Testicular hypofunction: Secondary | ICD-10-CM | POA: Diagnosis not present

## 2021-05-21 DIAGNOSIS — C4359 Malignant melanoma of other part of trunk: Secondary | ICD-10-CM | POA: Diagnosis not present

## 2021-05-21 DIAGNOSIS — L570 Actinic keratosis: Secondary | ICD-10-CM | POA: Diagnosis not present

## 2021-05-21 DIAGNOSIS — X32XXXA Exposure to sunlight, initial encounter: Secondary | ICD-10-CM | POA: Diagnosis not present

## 2021-05-30 DIAGNOSIS — D485 Neoplasm of uncertain behavior of skin: Secondary | ICD-10-CM | POA: Diagnosis not present

## 2021-06-03 DIAGNOSIS — E291 Testicular hypofunction: Secondary | ICD-10-CM | POA: Diagnosis not present

## 2021-06-11 DIAGNOSIS — C4359 Malignant melanoma of other part of trunk: Secondary | ICD-10-CM | POA: Diagnosis not present

## 2021-06-17 DIAGNOSIS — E291 Testicular hypofunction: Secondary | ICD-10-CM | POA: Diagnosis not present

## 2021-06-27 DIAGNOSIS — Z85820 Personal history of malignant melanoma of skin: Secondary | ICD-10-CM | POA: Diagnosis not present

## 2021-06-27 DIAGNOSIS — L80 Vitiligo: Secondary | ICD-10-CM | POA: Diagnosis not present

## 2021-06-27 DIAGNOSIS — C4359 Malignant melanoma of other part of trunk: Secondary | ICD-10-CM | POA: Diagnosis not present

## 2021-06-27 DIAGNOSIS — L905 Scar conditions and fibrosis of skin: Secondary | ICD-10-CM | POA: Diagnosis not present

## 2021-07-01 DIAGNOSIS — E291 Testicular hypofunction: Secondary | ICD-10-CM | POA: Diagnosis not present

## 2021-07-04 ENCOUNTER — Other Ambulatory Visit (HOSPITAL_COMMUNITY): Payer: Self-pay

## 2021-07-09 DIAGNOSIS — Z09 Encounter for follow-up examination after completed treatment for conditions other than malignant neoplasm: Secondary | ICD-10-CM | POA: Diagnosis not present

## 2021-07-09 DIAGNOSIS — C4359 Malignant melanoma of other part of trunk: Secondary | ICD-10-CM | POA: Diagnosis not present

## 2021-07-15 DIAGNOSIS — E291 Testicular hypofunction: Secondary | ICD-10-CM | POA: Diagnosis not present

## 2021-07-23 DIAGNOSIS — C4359 Malignant melanoma of other part of trunk: Secondary | ICD-10-CM | POA: Diagnosis not present

## 2021-07-23 DIAGNOSIS — Z09 Encounter for follow-up examination after completed treatment for conditions other than malignant neoplasm: Secondary | ICD-10-CM | POA: Diagnosis not present

## 2021-07-29 ENCOUNTER — Other Ambulatory Visit (HOSPITAL_COMMUNITY): Payer: Self-pay

## 2021-07-29 DIAGNOSIS — E291 Testicular hypofunction: Secondary | ICD-10-CM | POA: Diagnosis not present

## 2021-07-30 ENCOUNTER — Other Ambulatory Visit (HOSPITAL_COMMUNITY): Payer: Self-pay

## 2021-07-30 MED ORDER — ALPRAZOLAM 0.5 MG PO TABS
0.2500 mg | ORAL_TABLET | Freq: Two times a day (BID) | ORAL | 0 refills | Status: DC
  Filled 2021-08-04: qty 180, 90d supply, fill #0

## 2021-07-30 MED ORDER — ZOLPIDEM TARTRATE 10 MG PO TABS
10.0000 mg | ORAL_TABLET | Freq: Every evening | ORAL | 0 refills | Status: DC | PRN
  Filled 2021-08-04: qty 90, 90d supply, fill #0

## 2021-08-04 ENCOUNTER — Other Ambulatory Visit (HOSPITAL_COMMUNITY): Payer: Self-pay

## 2021-08-07 ENCOUNTER — Ambulatory Visit: Payer: PPO | Attending: Internal Medicine

## 2021-08-07 DIAGNOSIS — Z23 Encounter for immunization: Secondary | ICD-10-CM

## 2021-08-12 DIAGNOSIS — E291 Testicular hypofunction: Secondary | ICD-10-CM | POA: Diagnosis not present

## 2021-08-19 DIAGNOSIS — E291 Testicular hypofunction: Secondary | ICD-10-CM | POA: Diagnosis not present

## 2021-08-19 DIAGNOSIS — Z111 Encounter for screening for respiratory tuberculosis: Secondary | ICD-10-CM | POA: Diagnosis not present

## 2021-08-21 ENCOUNTER — Other Ambulatory Visit (HOSPITAL_COMMUNITY): Payer: Self-pay

## 2021-08-21 MED ORDER — AMOXICILLIN 875 MG PO TABS
875.0000 mg | ORAL_TABLET | Freq: Two times a day (BID) | ORAL | 0 refills | Status: DC
  Filled 2021-08-21: qty 20, 10d supply, fill #0

## 2021-08-22 ENCOUNTER — Other Ambulatory Visit (HOSPITAL_COMMUNITY): Payer: Self-pay

## 2021-08-26 DIAGNOSIS — E291 Testicular hypofunction: Secondary | ICD-10-CM | POA: Diagnosis not present

## 2021-09-15 ENCOUNTER — Other Ambulatory Visit (HOSPITAL_COMMUNITY): Payer: Self-pay

## 2021-09-16 DIAGNOSIS — E291 Testicular hypofunction: Secondary | ICD-10-CM | POA: Diagnosis not present

## 2021-09-30 DIAGNOSIS — E291 Testicular hypofunction: Secondary | ICD-10-CM | POA: Diagnosis not present

## 2021-10-10 ENCOUNTER — Other Ambulatory Visit (HOSPITAL_COMMUNITY): Payer: Self-pay

## 2021-10-14 DIAGNOSIS — E291 Testicular hypofunction: Secondary | ICD-10-CM | POA: Diagnosis not present

## 2021-10-14 DIAGNOSIS — E7800 Pure hypercholesterolemia, unspecified: Secondary | ICD-10-CM | POA: Diagnosis not present

## 2021-10-14 DIAGNOSIS — Z23 Encounter for immunization: Secondary | ICD-10-CM | POA: Diagnosis not present

## 2021-10-14 DIAGNOSIS — R7303 Prediabetes: Secondary | ICD-10-CM | POA: Diagnosis not present

## 2021-10-14 DIAGNOSIS — Z125 Encounter for screening for malignant neoplasm of prostate: Secondary | ICD-10-CM | POA: Diagnosis not present

## 2021-10-14 DIAGNOSIS — E039 Hypothyroidism, unspecified: Secondary | ICD-10-CM | POA: Diagnosis not present

## 2021-10-17 ENCOUNTER — Other Ambulatory Visit (HOSPITAL_COMMUNITY): Payer: Self-pay

## 2021-10-17 MED ORDER — ZOLPIDEM TARTRATE 10 MG PO TABS
10.0000 mg | ORAL_TABLET | Freq: Every evening | ORAL | 0 refills | Status: DC | PRN
  Filled 2021-11-03: qty 90, 90d supply, fill #0

## 2021-10-17 MED ORDER — ALPRAZOLAM 0.5 MG PO TABS
0.2500 mg | ORAL_TABLET | Freq: Two times a day (BID) | ORAL | 0 refills | Status: DC
  Filled 2021-10-17 – 2021-12-02 (×2): qty 180, 90d supply, fill #0

## 2021-10-20 ENCOUNTER — Other Ambulatory Visit (HOSPITAL_COMMUNITY): Payer: Self-pay

## 2021-10-28 DIAGNOSIS — E291 Testicular hypofunction: Secondary | ICD-10-CM | POA: Diagnosis not present

## 2021-10-29 ENCOUNTER — Other Ambulatory Visit (HOSPITAL_COMMUNITY): Payer: Self-pay

## 2021-10-30 ENCOUNTER — Other Ambulatory Visit (HOSPITAL_COMMUNITY): Payer: Self-pay

## 2021-10-30 DIAGNOSIS — D485 Neoplasm of uncertain behavior of skin: Secondary | ICD-10-CM | POA: Diagnosis not present

## 2021-10-30 DIAGNOSIS — Z08 Encounter for follow-up examination after completed treatment for malignant neoplasm: Secondary | ICD-10-CM | POA: Diagnosis not present

## 2021-10-30 DIAGNOSIS — Z85820 Personal history of malignant melanoma of skin: Secondary | ICD-10-CM | POA: Diagnosis not present

## 2021-10-30 DIAGNOSIS — Z1283 Encounter for screening for malignant neoplasm of skin: Secondary | ICD-10-CM | POA: Diagnosis not present

## 2021-10-30 DIAGNOSIS — D225 Melanocytic nevi of trunk: Secondary | ICD-10-CM | POA: Diagnosis not present

## 2021-11-03 ENCOUNTER — Other Ambulatory Visit (HOSPITAL_COMMUNITY): Payer: Self-pay

## 2021-11-11 DIAGNOSIS — E7800 Pure hypercholesterolemia, unspecified: Secondary | ICD-10-CM | POA: Diagnosis not present

## 2021-11-11 DIAGNOSIS — G4700 Insomnia, unspecified: Secondary | ICD-10-CM | POA: Diagnosis not present

## 2021-11-11 DIAGNOSIS — F419 Anxiety disorder, unspecified: Secondary | ICD-10-CM | POA: Diagnosis not present

## 2021-11-11 DIAGNOSIS — R7303 Prediabetes: Secondary | ICD-10-CM | POA: Diagnosis not present

## 2021-11-11 DIAGNOSIS — E291 Testicular hypofunction: Secondary | ICD-10-CM | POA: Diagnosis not present

## 2021-11-24 DIAGNOSIS — X32XXXD Exposure to sunlight, subsequent encounter: Secondary | ICD-10-CM | POA: Diagnosis not present

## 2021-11-24 DIAGNOSIS — L988 Other specified disorders of the skin and subcutaneous tissue: Secondary | ICD-10-CM | POA: Diagnosis not present

## 2021-11-24 DIAGNOSIS — D485 Neoplasm of uncertain behavior of skin: Secondary | ICD-10-CM | POA: Diagnosis not present

## 2021-11-24 DIAGNOSIS — L570 Actinic keratosis: Secondary | ICD-10-CM | POA: Diagnosis not present

## 2021-11-25 DIAGNOSIS — E291 Testicular hypofunction: Secondary | ICD-10-CM | POA: Diagnosis not present

## 2021-12-02 ENCOUNTER — Other Ambulatory Visit (HOSPITAL_COMMUNITY): Payer: Self-pay

## 2021-12-09 DIAGNOSIS — E291 Testicular hypofunction: Secondary | ICD-10-CM | POA: Diagnosis not present

## 2021-12-19 ENCOUNTER — Other Ambulatory Visit (HOSPITAL_COMMUNITY): Payer: Self-pay

## 2021-12-22 ENCOUNTER — Other Ambulatory Visit (HOSPITAL_COMMUNITY): Payer: Self-pay

## 2021-12-22 MED ORDER — OSELTAMIVIR PHOSPHATE 75 MG PO CAPS
75.0000 mg | ORAL_CAPSULE | Freq: Every day | ORAL | 0 refills | Status: DC
  Filled 2021-12-22: qty 10, 10d supply, fill #0

## 2021-12-23 DIAGNOSIS — E291 Testicular hypofunction: Secondary | ICD-10-CM | POA: Diagnosis not present

## 2021-12-25 ENCOUNTER — Other Ambulatory Visit (HOSPITAL_COMMUNITY): Payer: Self-pay

## 2021-12-25 DIAGNOSIS — J40 Bronchitis, not specified as acute or chronic: Secondary | ICD-10-CM | POA: Diagnosis not present

## 2021-12-25 DIAGNOSIS — J069 Acute upper respiratory infection, unspecified: Secondary | ICD-10-CM | POA: Diagnosis not present

## 2021-12-25 MED ORDER — AZITHROMYCIN 250 MG PO TABS
ORAL_TABLET | ORAL | 0 refills | Status: AC
  Filled 2021-12-25: qty 6, 5d supply, fill #0

## 2021-12-25 MED ORDER — GUAIFENESIN AC 100-10 MG/5ML PO SYRP
10.0000 mL | ORAL_SOLUTION | ORAL | 0 refills | Status: DC | PRN
  Filled 2021-12-25: qty 120, 2d supply, fill #0

## 2022-01-01 ENCOUNTER — Other Ambulatory Visit (HOSPITAL_COMMUNITY): Payer: Self-pay

## 2022-01-01 MED ORDER — PAXLOVID (300/100) 20 X 150 MG & 10 X 100MG PO TBPK
ORAL_TABLET | ORAL | 0 refills | Status: DC
  Filled 2022-01-01: qty 30, 5d supply, fill #0

## 2022-01-06 ENCOUNTER — Other Ambulatory Visit (HOSPITAL_COMMUNITY): Payer: Self-pay

## 2022-01-06 DIAGNOSIS — E291 Testicular hypofunction: Secondary | ICD-10-CM | POA: Diagnosis not present

## 2022-01-15 DIAGNOSIS — H903 Sensorineural hearing loss, bilateral: Secondary | ICD-10-CM | POA: Diagnosis not present

## 2022-01-20 DIAGNOSIS — E291 Testicular hypofunction: Secondary | ICD-10-CM | POA: Diagnosis not present

## 2022-01-21 DIAGNOSIS — C4359 Malignant melanoma of other part of trunk: Secondary | ICD-10-CM | POA: Diagnosis not present

## 2022-01-28 ENCOUNTER — Other Ambulatory Visit (HOSPITAL_COMMUNITY): Payer: Self-pay

## 2022-02-03 DIAGNOSIS — E291 Testicular hypofunction: Secondary | ICD-10-CM | POA: Diagnosis not present

## 2022-02-11 ENCOUNTER — Other Ambulatory Visit (HOSPITAL_COMMUNITY): Payer: Self-pay

## 2022-02-13 ENCOUNTER — Other Ambulatory Visit (HOSPITAL_COMMUNITY): Payer: Self-pay

## 2022-02-13 DIAGNOSIS — E291 Testicular hypofunction: Secondary | ICD-10-CM | POA: Diagnosis not present

## 2022-02-13 MED ORDER — ALPRAZOLAM 0.5 MG PO TABS
0.2500 mg | ORAL_TABLET | Freq: Two times a day (BID) | ORAL | 0 refills | Status: DC
  Filled 2022-03-04: qty 180, 90d supply, fill #0

## 2022-02-17 ENCOUNTER — Other Ambulatory Visit (HOSPITAL_COMMUNITY): Payer: Self-pay

## 2022-02-18 ENCOUNTER — Other Ambulatory Visit (HOSPITAL_COMMUNITY): Payer: Self-pay

## 2022-02-20 ENCOUNTER — Other Ambulatory Visit (HOSPITAL_COMMUNITY): Payer: Self-pay

## 2022-02-20 MED ORDER — ZOLPIDEM TARTRATE 10 MG PO TABS
10.0000 mg | ORAL_TABLET | Freq: Every evening | ORAL | 1 refills | Status: DC | PRN
  Filled 2022-02-20: qty 90, 90d supply, fill #0
  Filled 2022-06-19: qty 90, 90d supply, fill #1

## 2022-02-23 ENCOUNTER — Other Ambulatory Visit (HOSPITAL_COMMUNITY): Payer: Self-pay

## 2022-02-24 DIAGNOSIS — E291 Testicular hypofunction: Secondary | ICD-10-CM | POA: Diagnosis not present

## 2022-02-24 DIAGNOSIS — G5601 Carpal tunnel syndrome, right upper limb: Secondary | ICD-10-CM | POA: Diagnosis not present

## 2022-02-24 DIAGNOSIS — G5602 Carpal tunnel syndrome, left upper limb: Secondary | ICD-10-CM | POA: Diagnosis not present

## 2022-03-04 ENCOUNTER — Other Ambulatory Visit (HOSPITAL_COMMUNITY): Payer: Self-pay

## 2022-03-10 DIAGNOSIS — E291 Testicular hypofunction: Secondary | ICD-10-CM | POA: Diagnosis not present

## 2022-03-10 DIAGNOSIS — R5383 Other fatigue: Secondary | ICD-10-CM | POA: Diagnosis not present

## 2022-03-13 ENCOUNTER — Other Ambulatory Visit (HOSPITAL_BASED_OUTPATIENT_CLINIC_OR_DEPARTMENT_OTHER): Payer: Self-pay

## 2022-03-13 MED ORDER — AREXVY 120 MCG/0.5ML IM SUSR
INTRAMUSCULAR | 0 refills | Status: DC
  Filled 2022-03-13: qty 0.5, 1d supply, fill #0

## 2022-03-18 ENCOUNTER — Other Ambulatory Visit (HOSPITAL_COMMUNITY): Payer: Self-pay

## 2022-03-18 DIAGNOSIS — G5602 Carpal tunnel syndrome, left upper limb: Secondary | ICD-10-CM | POA: Diagnosis not present

## 2022-03-18 MED ORDER — OXYCODONE HCL 5 MG PO TABS
5.0000 mg | ORAL_TABLET | Freq: Four times a day (QID) | ORAL | 0 refills | Status: DC | PRN
  Filled 2022-03-18: qty 6, 2d supply, fill #0

## 2022-03-23 ENCOUNTER — Other Ambulatory Visit (HOSPITAL_BASED_OUTPATIENT_CLINIC_OR_DEPARTMENT_OTHER): Payer: Self-pay

## 2022-03-25 DIAGNOSIS — E291 Testicular hypofunction: Secondary | ICD-10-CM | POA: Diagnosis not present

## 2022-04-07 DIAGNOSIS — E291 Testicular hypofunction: Secondary | ICD-10-CM | POA: Diagnosis not present

## 2022-04-15 DIAGNOSIS — Z85820 Personal history of malignant melanoma of skin: Secondary | ICD-10-CM | POA: Diagnosis not present

## 2022-04-15 DIAGNOSIS — D485 Neoplasm of uncertain behavior of skin: Secondary | ICD-10-CM | POA: Diagnosis not present

## 2022-04-15 DIAGNOSIS — Z08 Encounter for follow-up examination after completed treatment for malignant neoplasm: Secondary | ICD-10-CM | POA: Diagnosis not present

## 2022-04-15 DIAGNOSIS — D225 Melanocytic nevi of trunk: Secondary | ICD-10-CM | POA: Diagnosis not present

## 2022-04-15 DIAGNOSIS — L821 Other seborrheic keratosis: Secondary | ICD-10-CM | POA: Diagnosis not present

## 2022-04-21 DIAGNOSIS — E291 Testicular hypofunction: Secondary | ICD-10-CM | POA: Diagnosis not present

## 2022-05-05 ENCOUNTER — Other Ambulatory Visit (HOSPITAL_COMMUNITY): Payer: Self-pay

## 2022-05-05 MED ORDER — TESTOSTERONE CYPIONATE 200 MG/ML IM SOLN
100.0000 mg | INTRAMUSCULAR | 0 refills | Status: DC
  Filled 2022-05-05: qty 1, 1d supply, fill #0

## 2022-05-06 ENCOUNTER — Other Ambulatory Visit (HOSPITAL_COMMUNITY): Payer: Self-pay

## 2022-05-06 DIAGNOSIS — D485 Neoplasm of uncertain behavior of skin: Secondary | ICD-10-CM | POA: Diagnosis not present

## 2022-05-06 DIAGNOSIS — L988 Other specified disorders of the skin and subcutaneous tissue: Secondary | ICD-10-CM | POA: Diagnosis not present

## 2022-05-06 DIAGNOSIS — E291 Testicular hypofunction: Secondary | ICD-10-CM | POA: Diagnosis not present

## 2022-05-08 ENCOUNTER — Other Ambulatory Visit (HOSPITAL_COMMUNITY): Payer: Self-pay

## 2022-05-08 MED ORDER — TESTOSTERONE CYPIONATE 200 MG/ML IM SOLN
300.0000 mg | INTRAMUSCULAR | 0 refills | Status: DC
  Filled 2022-05-12: qty 10, 56d supply, fill #0
  Filled 2022-05-12: qty 10, 84d supply, fill #0

## 2022-05-12 ENCOUNTER — Other Ambulatory Visit (HOSPITAL_COMMUNITY): Payer: Self-pay

## 2022-05-19 DIAGNOSIS — G4700 Insomnia, unspecified: Secondary | ICD-10-CM | POA: Diagnosis not present

## 2022-05-19 DIAGNOSIS — E291 Testicular hypofunction: Secondary | ICD-10-CM | POA: Diagnosis not present

## 2022-05-19 DIAGNOSIS — R5383 Other fatigue: Secondary | ICD-10-CM | POA: Diagnosis not present

## 2022-05-19 DIAGNOSIS — Z0000 Encounter for general adult medical examination without abnormal findings: Secondary | ICD-10-CM | POA: Diagnosis not present

## 2022-05-19 DIAGNOSIS — F419 Anxiety disorder, unspecified: Secondary | ICD-10-CM | POA: Diagnosis not present

## 2022-05-19 DIAGNOSIS — E7800 Pure hypercholesterolemia, unspecified: Secondary | ICD-10-CM | POA: Diagnosis not present

## 2022-05-19 DIAGNOSIS — R7303 Prediabetes: Secondary | ICD-10-CM | POA: Diagnosis not present

## 2022-05-19 DIAGNOSIS — Z125 Encounter for screening for malignant neoplasm of prostate: Secondary | ICD-10-CM | POA: Diagnosis not present

## 2022-06-02 DIAGNOSIS — E291 Testicular hypofunction: Secondary | ICD-10-CM | POA: Diagnosis not present

## 2022-06-16 DIAGNOSIS — E291 Testicular hypofunction: Secondary | ICD-10-CM | POA: Diagnosis not present

## 2022-06-17 DIAGNOSIS — R590 Localized enlarged lymph nodes: Secondary | ICD-10-CM | POA: Diagnosis not present

## 2022-06-17 DIAGNOSIS — C4339 Malignant melanoma of other parts of face: Secondary | ICD-10-CM | POA: Diagnosis not present

## 2022-06-19 ENCOUNTER — Other Ambulatory Visit (HOSPITAL_COMMUNITY): Payer: Self-pay

## 2022-06-19 MED ORDER — ALPRAZOLAM 0.5 MG PO TABS
0.2500 mg | ORAL_TABLET | Freq: Two times a day (BID) | ORAL | 0 refills | Status: DC
  Filled 2022-06-19: qty 180, 90d supply, fill #0

## 2022-06-29 DIAGNOSIS — R590 Localized enlarged lymph nodes: Secondary | ICD-10-CM | POA: Diagnosis not present

## 2022-06-29 DIAGNOSIS — C773 Secondary and unspecified malignant neoplasm of axilla and upper limb lymph nodes: Secondary | ICD-10-CM | POA: Diagnosis not present

## 2022-06-29 DIAGNOSIS — C439 Malignant melanoma of skin, unspecified: Secondary | ICD-10-CM | POA: Diagnosis not present

## 2022-07-01 DIAGNOSIS — E039 Hypothyroidism, unspecified: Secondary | ICD-10-CM | POA: Diagnosis not present

## 2022-07-01 DIAGNOSIS — E291 Testicular hypofunction: Secondary | ICD-10-CM | POA: Diagnosis not present

## 2022-07-03 ENCOUNTER — Other Ambulatory Visit: Payer: Self-pay | Admitting: General Surgery

## 2022-07-03 DIAGNOSIS — C439 Malignant melanoma of skin, unspecified: Secondary | ICD-10-CM

## 2022-07-03 DIAGNOSIS — C438 Malignant melanoma of overlapping sites of skin: Secondary | ICD-10-CM

## 2022-07-06 ENCOUNTER — Ambulatory Visit
Admission: RE | Admit: 2022-07-06 | Discharge: 2022-07-06 | Disposition: A | Discharge status: Home / Self Care | Payer: PPO | Source: Ambulatory Visit | Attending: General Surgery | Admitting: General Surgery

## 2022-07-06 ENCOUNTER — Other Ambulatory Visit (HOSPITAL_COMMUNITY): Payer: Self-pay

## 2022-07-06 DIAGNOSIS — Q048 Other specified congenital malformations of brain: Secondary | ICD-10-CM | POA: Diagnosis not present

## 2022-07-06 DIAGNOSIS — C439 Malignant melanoma of skin, unspecified: Secondary | ICD-10-CM | POA: Insufficient documentation

## 2022-07-06 DIAGNOSIS — I6782 Cerebral ischemia: Secondary | ICD-10-CM | POA: Diagnosis not present

## 2022-07-06 MED ORDER — GADOBUTROL 1 MMOL/ML IV SOLN
8.0000 mL | Freq: Once | INTRAVENOUS | Status: AC | PRN
  Administered 2022-07-06: 8 mL via INTRAVENOUS

## 2022-07-08 ENCOUNTER — Ambulatory Visit
Admission: RE | Admit: 2022-07-08 | Discharge: 2022-07-08 | Disposition: A | Discharge status: Home / Self Care | Payer: PPO | Source: Ambulatory Visit | Attending: General Surgery | Admitting: General Surgery

## 2022-07-08 DIAGNOSIS — R918 Other nonspecific abnormal finding of lung field: Secondary | ICD-10-CM | POA: Diagnosis not present

## 2022-07-08 DIAGNOSIS — I700 Atherosclerosis of aorta: Secondary | ICD-10-CM | POA: Insufficient documentation

## 2022-07-08 DIAGNOSIS — C438 Malignant melanoma of overlapping sites of skin: Secondary | ICD-10-CM | POA: Insufficient documentation

## 2022-07-08 DIAGNOSIS — C439 Malignant melanoma of skin, unspecified: Secondary | ICD-10-CM | POA: Diagnosis not present

## 2022-07-08 DIAGNOSIS — R590 Localized enlarged lymph nodes: Secondary | ICD-10-CM | POA: Diagnosis not present

## 2022-07-08 DIAGNOSIS — R911 Solitary pulmonary nodule: Secondary | ICD-10-CM | POA: Diagnosis not present

## 2022-07-08 LAB — GLUCOSE, CAPILLARY: Glucose-Capillary: 82 mg/dL (ref 70–99)

## 2022-07-08 MED ORDER — FLUDEOXYGLUCOSE F - 18 (FDG) INJECTION
9.1000 | Freq: Once | INTRAVENOUS | Status: AC | PRN
  Administered 2022-07-08: 9.9 via INTRAVENOUS

## 2022-07-13 DIAGNOSIS — C439 Malignant melanoma of skin, unspecified: Secondary | ICD-10-CM | POA: Diagnosis not present

## 2022-07-13 DIAGNOSIS — C4359 Malignant melanoma of other part of trunk: Secondary | ICD-10-CM | POA: Diagnosis not present

## 2022-07-13 DIAGNOSIS — Z79899 Other long term (current) drug therapy: Secondary | ICD-10-CM | POA: Diagnosis not present

## 2022-07-13 DIAGNOSIS — F419 Anxiety disorder, unspecified: Secondary | ICD-10-CM | POA: Diagnosis not present

## 2022-07-15 DIAGNOSIS — E291 Testicular hypofunction: Secondary | ICD-10-CM | POA: Diagnosis not present

## 2022-07-23 DIAGNOSIS — R918 Other nonspecific abnormal finding of lung field: Secondary | ICD-10-CM | POA: Diagnosis not present

## 2022-07-23 DIAGNOSIS — Z79891 Long term (current) use of opiate analgesic: Secondary | ICD-10-CM | POA: Diagnosis not present

## 2022-07-23 DIAGNOSIS — C3412 Malignant neoplasm of upper lobe, left bronchus or lung: Secondary | ICD-10-CM | POA: Diagnosis not present

## 2022-07-23 DIAGNOSIS — Z888 Allergy status to other drugs, medicaments and biological substances status: Secondary | ICD-10-CM | POA: Diagnosis not present

## 2022-07-23 DIAGNOSIS — J939 Pneumothorax, unspecified: Secondary | ICD-10-CM | POA: Diagnosis not present

## 2022-07-23 DIAGNOSIS — R911 Solitary pulmonary nodule: Secondary | ICD-10-CM | POA: Diagnosis not present

## 2022-07-23 DIAGNOSIS — C4359 Malignant melanoma of other part of trunk: Secondary | ICD-10-CM | POA: Diagnosis not present

## 2022-07-23 DIAGNOSIS — C3492 Malignant neoplasm of unspecified part of left bronchus or lung: Secondary | ICD-10-CM | POA: Diagnosis not present

## 2022-07-24 ENCOUNTER — Other Ambulatory Visit (HOSPITAL_COMMUNITY): Payer: Self-pay

## 2022-07-26 DIAGNOSIS — C4359 Malignant melanoma of other part of trunk: Secondary | ICD-10-CM | POA: Diagnosis not present

## 2022-07-28 DIAGNOSIS — C4359 Malignant melanoma of other part of trunk: Secondary | ICD-10-CM | POA: Diagnosis not present

## 2022-07-29 DIAGNOSIS — E291 Testicular hypofunction: Secondary | ICD-10-CM | POA: Diagnosis not present

## 2022-07-29 DIAGNOSIS — Z1283 Encounter for screening for malignant neoplasm of skin: Secondary | ICD-10-CM | POA: Diagnosis not present

## 2022-07-29 DIAGNOSIS — Z85820 Personal history of malignant melanoma of skin: Secondary | ICD-10-CM | POA: Diagnosis not present

## 2022-07-29 DIAGNOSIS — D225 Melanocytic nevi of trunk: Secondary | ICD-10-CM | POA: Diagnosis not present

## 2022-07-29 DIAGNOSIS — Z08 Encounter for follow-up examination after completed treatment for malignant neoplasm: Secondary | ICD-10-CM | POA: Diagnosis not present

## 2022-07-31 DIAGNOSIS — C3412 Malignant neoplasm of upper lobe, left bronchus or lung: Secondary | ICD-10-CM | POA: Diagnosis not present

## 2022-07-31 DIAGNOSIS — C3490 Malignant neoplasm of unspecified part of unspecified bronchus or lung: Secondary | ICD-10-CM | POA: Diagnosis not present

## 2022-07-31 DIAGNOSIS — R918 Other nonspecific abnormal finding of lung field: Secondary | ICD-10-CM | POA: Diagnosis not present

## 2022-07-31 DIAGNOSIS — R942 Abnormal results of pulmonary function studies: Secondary | ICD-10-CM | POA: Diagnosis not present

## 2022-08-03 DIAGNOSIS — R918 Other nonspecific abnormal finding of lung field: Secondary | ICD-10-CM | POA: Diagnosis not present

## 2022-08-03 DIAGNOSIS — C3490 Malignant neoplasm of unspecified part of unspecified bronchus or lung: Secondary | ICD-10-CM | POA: Diagnosis not present

## 2022-08-05 DIAGNOSIS — Z79899 Other long term (current) drug therapy: Secondary | ICD-10-CM | POA: Diagnosis not present

## 2022-08-05 DIAGNOSIS — Z1159 Encounter for screening for other viral diseases: Secondary | ICD-10-CM | POA: Diagnosis not present

## 2022-08-05 DIAGNOSIS — Z5112 Encounter for antineoplastic immunotherapy: Secondary | ICD-10-CM | POA: Diagnosis not present

## 2022-08-05 DIAGNOSIS — C4359 Malignant melanoma of other part of trunk: Secondary | ICD-10-CM | POA: Diagnosis not present

## 2022-08-05 DIAGNOSIS — C3412 Malignant neoplasm of upper lobe, left bronchus or lung: Secondary | ICD-10-CM | POA: Diagnosis not present

## 2022-08-12 DIAGNOSIS — E291 Testicular hypofunction: Secondary | ICD-10-CM | POA: Diagnosis not present

## 2022-08-19 ENCOUNTER — Other Ambulatory Visit (HOSPITAL_COMMUNITY): Payer: Self-pay

## 2022-08-26 DIAGNOSIS — C4359 Malignant melanoma of other part of trunk: Secondary | ICD-10-CM | POA: Diagnosis not present

## 2022-08-26 DIAGNOSIS — E291 Testicular hypofunction: Secondary | ICD-10-CM | POA: Diagnosis not present

## 2022-08-26 DIAGNOSIS — Z79899 Other long term (current) drug therapy: Secondary | ICD-10-CM | POA: Diagnosis not present

## 2022-08-26 DIAGNOSIS — Z1159 Encounter for screening for other viral diseases: Secondary | ICD-10-CM | POA: Diagnosis not present

## 2022-08-26 DIAGNOSIS — C3412 Malignant neoplasm of upper lobe, left bronchus or lung: Secondary | ICD-10-CM | POA: Diagnosis not present

## 2022-08-27 DIAGNOSIS — Z79899 Other long term (current) drug therapy: Secondary | ICD-10-CM | POA: Diagnosis not present

## 2022-08-27 DIAGNOSIS — Z1159 Encounter for screening for other viral diseases: Secondary | ICD-10-CM | POA: Diagnosis not present

## 2022-08-27 DIAGNOSIS — C4359 Malignant melanoma of other part of trunk: Secondary | ICD-10-CM | POA: Diagnosis not present

## 2022-08-27 DIAGNOSIS — Z5112 Encounter for antineoplastic immunotherapy: Secondary | ICD-10-CM | POA: Diagnosis not present

## 2022-09-02 ENCOUNTER — Other Ambulatory Visit (HOSPITAL_COMMUNITY): Payer: Self-pay

## 2022-09-02 MED ORDER — MELOXICAM 15 MG PO TABS
15.0000 mg | ORAL_TABLET | Freq: Every day | ORAL | 0 refills | Status: DC
  Filled 2022-09-02: qty 14, 14d supply, fill #0

## 2022-09-03 ENCOUNTER — Other Ambulatory Visit (HOSPITAL_COMMUNITY): Payer: Self-pay

## 2022-09-03 MED ORDER — TRAMADOL HCL 50 MG PO TABS
50.0000 mg | ORAL_TABLET | Freq: Four times a day (QID) | ORAL | 0 refills | Status: DC | PRN
  Filled 2022-09-03: qty 30, 5d supply, fill #0

## 2022-09-03 MED ORDER — CYCLOBENZAPRINE HCL 10 MG PO TABS
10.0000 mg | ORAL_TABLET | Freq: Three times a day (TID) | ORAL | 0 refills | Status: DC | PRN
  Filled 2022-09-03: qty 30, 10d supply, fill #0

## 2022-09-04 ENCOUNTER — Other Ambulatory Visit (HOSPITAL_COMMUNITY): Payer: Self-pay

## 2022-09-04 MED ORDER — ZOLPIDEM TARTRATE 10 MG PO TABS
10.0000 mg | ORAL_TABLET | Freq: Every evening | ORAL | 1 refills | Status: DC | PRN
  Filled 2022-09-08 – 2022-09-15 (×2): qty 90, 90d supply, fill #0
  Filled 2022-12-11 – 2023-01-18 (×2): qty 90, 90d supply, fill #1

## 2022-09-08 ENCOUNTER — Other Ambulatory Visit (HOSPITAL_COMMUNITY): Payer: Self-pay

## 2022-09-09 DIAGNOSIS — M549 Dorsalgia, unspecified: Secondary | ICD-10-CM | POA: Diagnosis not present

## 2022-09-09 DIAGNOSIS — C4359 Malignant melanoma of other part of trunk: Secondary | ICD-10-CM | POA: Diagnosis not present

## 2022-09-09 DIAGNOSIS — R590 Localized enlarged lymph nodes: Secondary | ICD-10-CM | POA: Diagnosis not present

## 2022-09-09 DIAGNOSIS — C3412 Malignant neoplasm of upper lobe, left bronchus or lung: Secondary | ICD-10-CM | POA: Diagnosis not present

## 2022-09-09 DIAGNOSIS — R1900 Intra-abdominal and pelvic swelling, mass and lump, unspecified site: Secondary | ICD-10-CM | POA: Diagnosis not present

## 2022-09-09 DIAGNOSIS — R918 Other nonspecific abnormal finding of lung field: Secondary | ICD-10-CM | POA: Diagnosis not present

## 2022-09-09 DIAGNOSIS — R791 Abnormal coagulation profile: Secondary | ICD-10-CM | POA: Diagnosis not present

## 2022-09-09 DIAGNOSIS — Z79899 Other long term (current) drug therapy: Secondary | ICD-10-CM | POA: Diagnosis not present

## 2022-09-09 DIAGNOSIS — Z87891 Personal history of nicotine dependence: Secondary | ICD-10-CM | POA: Diagnosis not present

## 2022-09-09 DIAGNOSIS — C439 Malignant melanoma of skin, unspecified: Secondary | ICD-10-CM | POA: Diagnosis not present

## 2022-09-10 DIAGNOSIS — E291 Testicular hypofunction: Secondary | ICD-10-CM | POA: Diagnosis not present

## 2022-09-14 ENCOUNTER — Other Ambulatory Visit (HOSPITAL_COMMUNITY): Payer: Self-pay

## 2022-09-14 DIAGNOSIS — R918 Other nonspecific abnormal finding of lung field: Secondary | ICD-10-CM | POA: Diagnosis not present

## 2022-09-15 ENCOUNTER — Other Ambulatory Visit (HOSPITAL_COMMUNITY): Payer: Self-pay

## 2022-09-23 DIAGNOSIS — E291 Testicular hypofunction: Secondary | ICD-10-CM | POA: Diagnosis not present

## 2022-09-24 DIAGNOSIS — N189 Chronic kidney disease, unspecified: Secondary | ICD-10-CM | POA: Diagnosis not present

## 2022-09-24 DIAGNOSIS — K5900 Constipation, unspecified: Secondary | ICD-10-CM | POA: Diagnosis not present

## 2022-09-24 DIAGNOSIS — C439 Malignant melanoma of skin, unspecified: Secondary | ICD-10-CM | POA: Diagnosis not present

## 2022-09-24 DIAGNOSIS — T797XXA Traumatic subcutaneous emphysema, initial encounter: Secondary | ICD-10-CM | POA: Diagnosis not present

## 2022-09-24 DIAGNOSIS — C773 Secondary and unspecified malignant neoplasm of axilla and upper limb lymph nodes: Secondary | ICD-10-CM | POA: Diagnosis not present

## 2022-09-24 DIAGNOSIS — R079 Chest pain, unspecified: Secondary | ICD-10-CM | POA: Diagnosis not present

## 2022-09-24 DIAGNOSIS — Z4682 Encounter for fitting and adjustment of non-vascular catheter: Secondary | ICD-10-CM | POA: Diagnosis not present

## 2022-09-24 DIAGNOSIS — C4359 Malignant melanoma of other part of trunk: Secondary | ICD-10-CM | POA: Diagnosis not present

## 2022-09-24 DIAGNOSIS — G4700 Insomnia, unspecified: Secondary | ICD-10-CM | POA: Diagnosis not present

## 2022-09-24 DIAGNOSIS — L299 Pruritus, unspecified: Secondary | ICD-10-CM | POA: Diagnosis not present

## 2022-09-24 DIAGNOSIS — E876 Hypokalemia: Secondary | ICD-10-CM | POA: Diagnosis not present

## 2022-09-24 DIAGNOSIS — J9811 Atelectasis: Secondary | ICD-10-CM | POA: Diagnosis not present

## 2022-09-24 DIAGNOSIS — I10 Essential (primary) hypertension: Secondary | ICD-10-CM | POA: Diagnosis not present

## 2022-09-24 DIAGNOSIS — F419 Anxiety disorder, unspecified: Secondary | ICD-10-CM | POA: Diagnosis not present

## 2022-09-24 DIAGNOSIS — D649 Anemia, unspecified: Secondary | ICD-10-CM | POA: Diagnosis not present

## 2022-09-24 DIAGNOSIS — R748 Abnormal levels of other serum enzymes: Secondary | ICD-10-CM | POA: Diagnosis not present

## 2022-09-24 DIAGNOSIS — C3490 Malignant neoplasm of unspecified part of unspecified bronchus or lung: Secondary | ICD-10-CM | POA: Diagnosis not present

## 2022-09-24 DIAGNOSIS — E2740 Unspecified adrenocortical insufficiency: Secondary | ICD-10-CM | POA: Diagnosis not present

## 2022-09-24 DIAGNOSIS — E875 Hyperkalemia: Secondary | ICD-10-CM | POA: Diagnosis not present

## 2022-09-24 DIAGNOSIS — C3412 Malignant neoplasm of upper lobe, left bronchus or lung: Secondary | ICD-10-CM | POA: Diagnosis not present

## 2022-09-24 DIAGNOSIS — Z7982 Long term (current) use of aspirin: Secondary | ICD-10-CM | POA: Diagnosis not present

## 2022-09-24 DIAGNOSIS — I4891 Unspecified atrial fibrillation: Secondary | ICD-10-CM | POA: Diagnosis not present

## 2022-09-24 DIAGNOSIS — Z9889 Other specified postprocedural states: Secondary | ICD-10-CM | POA: Diagnosis not present

## 2022-09-24 DIAGNOSIS — G8918 Other acute postprocedural pain: Secondary | ICD-10-CM | POA: Diagnosis not present

## 2022-09-24 DIAGNOSIS — E871 Hypo-osmolality and hyponatremia: Secondary | ICD-10-CM | POA: Diagnosis not present

## 2022-09-24 DIAGNOSIS — R339 Retention of urine, unspecified: Secondary | ICD-10-CM | POA: Diagnosis not present

## 2022-09-24 DIAGNOSIS — I129 Hypertensive chronic kidney disease with stage 1 through stage 4 chronic kidney disease, or unspecified chronic kidney disease: Secondary | ICD-10-CM | POA: Diagnosis not present

## 2022-09-24 DIAGNOSIS — J939 Pneumothorax, unspecified: Secondary | ICD-10-CM | POA: Diagnosis not present

## 2022-09-24 DIAGNOSIS — Z87891 Personal history of nicotine dependence: Secondary | ICD-10-CM | POA: Diagnosis not present

## 2022-09-25 DIAGNOSIS — G8918 Other acute postprocedural pain: Secondary | ICD-10-CM | POA: Diagnosis not present

## 2022-09-25 DIAGNOSIS — J9811 Atelectasis: Secondary | ICD-10-CM | POA: Diagnosis not present

## 2022-09-25 DIAGNOSIS — R079 Chest pain, unspecified: Secondary | ICD-10-CM | POA: Diagnosis not present

## 2022-09-25 DIAGNOSIS — J939 Pneumothorax, unspecified: Secondary | ICD-10-CM | POA: Diagnosis not present

## 2022-09-25 DIAGNOSIS — Z9889 Other specified postprocedural states: Secondary | ICD-10-CM | POA: Diagnosis not present

## 2022-09-26 DIAGNOSIS — C3490 Malignant neoplasm of unspecified part of unspecified bronchus or lung: Secondary | ICD-10-CM | POA: Diagnosis not present

## 2022-09-26 DIAGNOSIS — C3412 Malignant neoplasm of upper lobe, left bronchus or lung: Secondary | ICD-10-CM | POA: Diagnosis not present

## 2022-09-26 DIAGNOSIS — R079 Chest pain, unspecified: Secondary | ICD-10-CM | POA: Diagnosis not present

## 2022-09-26 DIAGNOSIS — G8918 Other acute postprocedural pain: Secondary | ICD-10-CM | POA: Diagnosis not present

## 2022-09-27 DIAGNOSIS — C3490 Malignant neoplasm of unspecified part of unspecified bronchus or lung: Secondary | ICD-10-CM | POA: Diagnosis not present

## 2022-09-27 DIAGNOSIS — E871 Hypo-osmolality and hyponatremia: Secondary | ICD-10-CM | POA: Diagnosis not present

## 2022-09-27 DIAGNOSIS — Z9889 Other specified postprocedural states: Secondary | ICD-10-CM | POA: Diagnosis not present

## 2022-09-27 DIAGNOSIS — E875 Hyperkalemia: Secondary | ICD-10-CM | POA: Diagnosis not present

## 2022-09-27 DIAGNOSIS — I10 Essential (primary) hypertension: Secondary | ICD-10-CM | POA: Diagnosis not present

## 2022-09-27 DIAGNOSIS — R079 Chest pain, unspecified: Secondary | ICD-10-CM | POA: Diagnosis not present

## 2022-09-27 DIAGNOSIS — C3412 Malignant neoplasm of upper lobe, left bronchus or lung: Secondary | ICD-10-CM | POA: Diagnosis not present

## 2022-09-27 DIAGNOSIS — J939 Pneumothorax, unspecified: Secondary | ICD-10-CM | POA: Diagnosis not present

## 2022-09-27 DIAGNOSIS — G8918 Other acute postprocedural pain: Secondary | ICD-10-CM | POA: Diagnosis not present

## 2022-09-28 DIAGNOSIS — Z4682 Encounter for fitting and adjustment of non-vascular catheter: Secondary | ICD-10-CM | POA: Diagnosis not present

## 2022-09-28 DIAGNOSIS — I10 Essential (primary) hypertension: Secondary | ICD-10-CM | POA: Diagnosis not present

## 2022-09-28 DIAGNOSIS — J939 Pneumothorax, unspecified: Secondary | ICD-10-CM | POA: Diagnosis not present

## 2022-09-28 DIAGNOSIS — E875 Hyperkalemia: Secondary | ICD-10-CM | POA: Diagnosis not present

## 2022-09-29 DIAGNOSIS — Z4682 Encounter for fitting and adjustment of non-vascular catheter: Secondary | ICD-10-CM | POA: Diagnosis not present

## 2022-09-29 DIAGNOSIS — R748 Abnormal levels of other serum enzymes: Secondary | ICD-10-CM | POA: Diagnosis not present

## 2022-09-29 DIAGNOSIS — R079 Chest pain, unspecified: Secondary | ICD-10-CM | POA: Diagnosis not present

## 2022-09-29 DIAGNOSIS — I10 Essential (primary) hypertension: Secondary | ICD-10-CM | POA: Diagnosis not present

## 2022-09-29 DIAGNOSIS — T797XXA Traumatic subcutaneous emphysema, initial encounter: Secondary | ICD-10-CM | POA: Diagnosis not present

## 2022-09-29 DIAGNOSIS — J939 Pneumothorax, unspecified: Secondary | ICD-10-CM | POA: Diagnosis not present

## 2022-09-29 DIAGNOSIS — E875 Hyperkalemia: Secondary | ICD-10-CM | POA: Diagnosis not present

## 2022-09-29 DIAGNOSIS — G8918 Other acute postprocedural pain: Secondary | ICD-10-CM | POA: Diagnosis not present

## 2022-09-30 DIAGNOSIS — J939 Pneumothorax, unspecified: Secondary | ICD-10-CM | POA: Diagnosis not present

## 2022-09-30 DIAGNOSIS — J9811 Atelectasis: Secondary | ICD-10-CM | POA: Diagnosis not present

## 2022-09-30 DIAGNOSIS — Z4682 Encounter for fitting and adjustment of non-vascular catheter: Secondary | ICD-10-CM | POA: Diagnosis not present

## 2022-10-01 DIAGNOSIS — J939 Pneumothorax, unspecified: Secondary | ICD-10-CM | POA: Diagnosis not present

## 2022-10-02 DIAGNOSIS — Z4682 Encounter for fitting and adjustment of non-vascular catheter: Secondary | ICD-10-CM | POA: Diagnosis not present

## 2022-10-02 DIAGNOSIS — J939 Pneumothorax, unspecified: Secondary | ICD-10-CM | POA: Diagnosis not present

## 2022-10-03 DIAGNOSIS — Z9889 Other specified postprocedural states: Secondary | ICD-10-CM | POA: Diagnosis not present

## 2022-10-03 DIAGNOSIS — J939 Pneumothorax, unspecified: Secondary | ICD-10-CM | POA: Diagnosis not present

## 2022-10-03 DIAGNOSIS — Z4682 Encounter for fitting and adjustment of non-vascular catheter: Secondary | ICD-10-CM | POA: Diagnosis not present

## 2022-10-05 ENCOUNTER — Other Ambulatory Visit (HOSPITAL_COMMUNITY): Payer: Self-pay

## 2022-10-05 MED ORDER — ALPRAZOLAM 0.5 MG PO TABS
0.2500 mg | ORAL_TABLET | Freq: Two times a day (BID) | ORAL | 0 refills | Status: DC
  Filled 2022-10-05: qty 180, 90d supply, fill #0

## 2022-10-06 ENCOUNTER — Other Ambulatory Visit (HOSPITAL_COMMUNITY): Payer: Self-pay

## 2022-10-06 MED ORDER — METOPROLOL TARTRATE 50 MG PO TABS
50.0000 mg | ORAL_TABLET | Freq: Two times a day (BID) | ORAL | 1 refills | Status: DC
  Filled 2022-10-06: qty 180, 90d supply, fill #0
  Filled 2023-01-18: qty 180, 90d supply, fill #1

## 2022-10-07 ENCOUNTER — Other Ambulatory Visit (HOSPITAL_COMMUNITY): Payer: Self-pay

## 2022-10-07 DIAGNOSIS — E291 Testicular hypofunction: Secondary | ICD-10-CM | POA: Diagnosis not present

## 2022-10-07 DIAGNOSIS — C3492 Malignant neoplasm of unspecified part of left bronchus or lung: Secondary | ICD-10-CM | POA: Diagnosis not present

## 2022-10-07 DIAGNOSIS — Z79899 Other long term (current) drug therapy: Secondary | ICD-10-CM | POA: Diagnosis not present

## 2022-10-07 DIAGNOSIS — C3412 Malignant neoplasm of upper lobe, left bronchus or lung: Secondary | ICD-10-CM | POA: Diagnosis not present

## 2022-10-07 DIAGNOSIS — C4359 Malignant melanoma of other part of trunk: Secondary | ICD-10-CM | POA: Diagnosis not present

## 2022-10-07 MED ORDER — HYDROMORPHONE HCL 2 MG PO TABS
2.0000 mg | ORAL_TABLET | Freq: Two times a day (BID) | ORAL | 0 refills | Status: DC
  Filled 2022-10-07: qty 10, 5d supply, fill #0

## 2022-10-09 DIAGNOSIS — J90 Pleural effusion, not elsewhere classified: Secondary | ICD-10-CM | POA: Diagnosis not present

## 2022-10-09 DIAGNOSIS — R9389 Abnormal findings on diagnostic imaging of other specified body structures: Secondary | ICD-10-CM | POA: Diagnosis not present

## 2022-10-09 DIAGNOSIS — J9811 Atelectasis: Secondary | ICD-10-CM | POA: Diagnosis not present

## 2022-10-09 DIAGNOSIS — C3490 Malignant neoplasm of unspecified part of unspecified bronchus or lung: Secondary | ICD-10-CM | POA: Diagnosis not present

## 2022-10-09 DIAGNOSIS — C3492 Malignant neoplasm of unspecified part of left bronchus or lung: Secondary | ICD-10-CM | POA: Diagnosis not present

## 2022-10-10 ENCOUNTER — Emergency Department (HOSPITAL_BASED_OUTPATIENT_CLINIC_OR_DEPARTMENT_OTHER): Payer: PPO | Admitting: Radiology

## 2022-10-10 ENCOUNTER — Emergency Department (HOSPITAL_BASED_OUTPATIENT_CLINIC_OR_DEPARTMENT_OTHER)
Admission: EM | Admit: 2022-10-10 | Discharge: 2022-10-10 | Disposition: A | Discharge status: Home / Self Care | Payer: PPO

## 2022-10-10 ENCOUNTER — Other Ambulatory Visit: Payer: Self-pay

## 2022-10-10 ENCOUNTER — Encounter (HOSPITAL_BASED_OUTPATIENT_CLINIC_OR_DEPARTMENT_OTHER): Payer: Self-pay

## 2022-10-10 DIAGNOSIS — J90 Pleural effusion, not elsewhere classified: Secondary | ICD-10-CM

## 2022-10-10 DIAGNOSIS — Z7982 Long term (current) use of aspirin: Secondary | ICD-10-CM | POA: Insufficient documentation

## 2022-10-10 DIAGNOSIS — D72829 Elevated white blood cell count, unspecified: Secondary | ICD-10-CM | POA: Diagnosis not present

## 2022-10-10 DIAGNOSIS — J9811 Atelectasis: Secondary | ICD-10-CM | POA: Diagnosis not present

## 2022-10-10 DIAGNOSIS — R42 Dizziness and giddiness: Secondary | ICD-10-CM | POA: Diagnosis not present

## 2022-10-10 DIAGNOSIS — R918 Other nonspecific abnormal finding of lung field: Secondary | ICD-10-CM | POA: Diagnosis not present

## 2022-10-10 DIAGNOSIS — E039 Hypothyroidism, unspecified: Secondary | ICD-10-CM | POA: Insufficient documentation

## 2022-10-10 LAB — COMPREHENSIVE METABOLIC PANEL
ALT: 18 U/L (ref 0–44)
AST: 14 U/L — ABNORMAL LOW (ref 15–41)
Albumin: 3.8 g/dL (ref 3.5–5.0)
Alkaline Phosphatase: 24 U/L — ABNORMAL LOW (ref 38–126)
Anion gap: 6 (ref 5–15)
BUN: 14 mg/dL (ref 8–23)
CO2: 29 mmol/L (ref 22–32)
Calcium: 9.1 mg/dL (ref 8.9–10.3)
Chloride: 98 mmol/L (ref 98–111)
Creatinine, Ser: 0.92 mg/dL (ref 0.61–1.24)
GFR, Estimated: >60 mL/min (ref >60)
Glucose, Bld: 105 mg/dL — ABNORMAL HIGH (ref 70–99)
Potassium: 4.2 mmol/L (ref 3.5–5.1)
Sodium: 133 mmol/L — ABNORMAL LOW (ref 135–145)
Total Bilirubin: 0.9 mg/dL (ref 0.3–1.2)
Total Protein: 6.5 g/dL (ref 6.5–8.1)

## 2022-10-10 LAB — CBC WITH DIFFERENTIAL/PLATELET
Abs Immature Granulocytes: 0.08 10*3/uL — ABNORMAL HIGH (ref 0.00–0.07)
Basophils Absolute: 0 10*3/uL (ref 0.0–0.1)
Basophils Relative: 0 %
Eosinophils Absolute: 0 10*3/uL (ref 0.0–0.5)
Eosinophils Relative: 0 %
HCT: 42.9 % (ref 39.0–52.0)
Hemoglobin: 14.8 g/dL (ref 13.0–17.0)
Immature Granulocytes: 1 %
Lymphocytes Relative: 7 %
Lymphs Abs: 0.9 10*3/uL (ref 0.7–4.0)
MCH: 32.8 pg (ref 26.0–34.0)
MCHC: 34.5 g/dL (ref 30.0–36.0)
MCV: 95.1 fL (ref 80.0–100.0)
Monocytes Absolute: 0.8 10*3/uL (ref 0.1–1.0)
Monocytes Relative: 7 %
Neutro Abs: 10.6 10*3/uL — ABNORMAL HIGH (ref 1.7–7.7)
Neutrophils Relative %: 85 %
Platelets: 342 10*3/uL (ref 150–400)
RBC: 4.51 MIL/uL (ref 4.22–5.81)
RDW: 12.3 % (ref 11.5–15.5)
WBC: 12.5 10*3/uL — ABNORMAL HIGH (ref 4.0–10.5)
nRBC: 0 % (ref 0.0–0.2)

## 2022-10-10 LAB — CBG MONITORING, ED: Glucose-Capillary: 118 mg/dL — ABNORMAL HIGH (ref 70–99)

## 2022-10-10 MED ORDER — MECLIZINE HCL 25 MG PO TABS
25.0000 mg | ORAL_TABLET | Freq: Once | ORAL | Status: AC
  Administered 2022-10-10: 25 mg via ORAL
  Filled 2022-10-10: qty 1

## 2022-10-10 NOTE — Discharge Instructions (Addendum)
Your x-ray results are as below:  FINDINGS: Surgical clips and drain left axilla. New left pleural effusion with some atelectasis/infiltrate at the left lung base. Right lung clear. Heart size normal.   Aortic Atherosclerosis (ICD10-170.0).   Visualized bones unremarkable.   IMPRESSION: New left pleural effusion with left basilar atelectasis/infiltrate.     Electronically Signed   By: Corlis Leak M.D.   On: 10/10/2022 13:00   Please contact your team at [facility] regarding this finding of pleural effusion and also to discuss your metoprolol and amlodipine as these medications could be causing your dizziness.  You have been given the copy of the x-ray today.  Return to the ER if you have any difficulty breathing, shortness of breath, fever or chills, facial droop, slurred speech, loss of consciousness, any other new or concerning symptoms.

## 2022-10-10 NOTE — ED Provider Notes (Signed)
[place] EMERGENCY DEPARTMENT AT [facility] Provider Note   CSN: [id number] Arrival date & time: 10/10/22  0957     History  Chief Complaint  Patient presents with   Dizziness    Samuel Turner is a 77 y.o. male with a history of lobectomy at [facility] on 09/24/2022 for adenocarcinoma of the left lung, malignant melanoma of torso recently removed at [facility], hypothyroidism, presents with concern for episodic dizziness that started last night.  States he took a bunch of medications all at once last night, including amlodipine and metoprolol which are new for him last couple weeks.  He started noticing room spinning with changes in position.  Denies any double vision, weakness, slurred speech, facial droop.  Reports he was started on the metoprolol due to a run of A-fib right after surgery that resolved after about 3 to 4 hours.   Dizziness      Home Medications Prior to Admission medications   Medication Sig Start Date End Date Taking? Authorizing Provider  ALPRAZolam Prudy Feeler) 0.5 MG tablet Take 0.5 mg by mouth at bedtime as needed for sleep.    [provider]  ALPRAZolam Prudy Feeler) 0.5 MG tablet Take 0.5-1 tablets (0.25-0.5 mg total) by mouth 2 (two) times daily. 12/25/20     ALPRAZolam (XANAX) 0.5 MG tablet Take 1/2-1 tablet (0.25-0.5 mg total) by mouth 2 (two) times daily. 02/12/22     ALPRAZolam (XANAX) 0.5 MG tablet Take 1/2 -1 tablets (0.25-0.5 mg total) by mouth 2 (two) times daily. 10/05/22     amoxicillin (AMOXIL) 875 MG tablet Take 1 tablet (875 mg total) by mouth every 12 (twelve) hours for 10 days 08/21/21     aspirin 81 MG chewable tablet Chew 81 mg by mouth daily.    [provider]  cephALEXin (KEFLEX) 500 MG capsule Take 1 capsule by mouth twice a day for 7 days 12/27/20     cholecalciferol (VITAMIN D) 1000 UNITS tablet Take 1,000 Units by mouth daily.    [provider]  COVID-19 mRNA bivalent vaccine, Pfizer, (PFIZER COVID-19 VAC BIVALENT)  injection Inject into the muscle. 10/03/20   Judyann Munson, MD  COVID-19 mRNA Vac-TriS, Pfizer, (PFIZER-BIONT COVID-19 VAC-TRIS) SUSP injection Inject into the muscle. 07/26/20   Judyann Munson, MD  cyclobenzaprine (FLEXERIL) 10 MG tablet Take 1 tablet (10 mg total) by mouth 3 (three) times daily as needed for muscle spasm 09/03/22     guaiFENesin-codeine (GUAIFENESIN AC) 100-10 MG/5ML syrup Take 10 mLs by mouth every 4 (four) hours as needed. 12/25/21     HYDROmorphone (DILAUDID) 2 MG tablet Take 1 tablet (2 mg total) by mouth every 12 (twelve) hours as needed for moderate pain for up to 5 days. 10/07/22     influenza vaccine adjuvanted (FLUAD) 0.5 ML injection Inject into the muscle. 10/03/20     meloxicam (MOBIC) 15 MG tablet Take 1 tablet (15 mg total) by mouth daily for 10-14 days, then take 1 tablet daily as needed after that. Patient not taking: Reported on 12/18/2020 07/29/20     meloxicam (MOBIC) 15 MG tablet Take 1 tablet (15 mg total) by mouth daily after meals for 7-10 days 02/14/21     methocarbamol (ROBAXIN) 500 MG tablet Take 1 tablet (500 mg total) by mouth 3 (three) times daily as needed for spasm & pain Patient not taking: Reported on 12/18/2020 07/30/20     metoprolol tartrate (LOPRESSOR) 50 MG tablet Take 1 tablet (50 mg total) by mouth 2 (two) times  daily with food. 10/06/22     Multiple Vitamin (MULTIVITAMIN) tablet Take 1 tablet by mouth daily.    [provider]  nirmatrelvir & ritonavir (PAXLOVID, 300/100,) 20 x 150 MG & 10 x 100MG  TBPK Take as directed for 5 days 01/01/22     Omega-3 Fatty Acids (FISH OIL) 1000 MG CAPS Take 1 capsule by mouth daily.    [provider]  omeprazole (PRILOSEC) 40 MG capsule TAKE 1 CAPSULE (40 MG TOTAL) BY MOUTH DAILY BEFORE DINNER Patient not taking: Reported on 12/18/2020 02/14/20 02/13/21  Drema Halon, MD  oseltamivir (TAMIFLU) 75 MG capsule Take 1 capsule (75 mg total) by mouth daily for 10 days 12/22/21   Elias Else,  MD  oxyCODONE (OXY IR/ROXICODONE) 5 MG immediate release tablet Take 1 tablet by mouth every 6 hours as needed only for severe postop pain 03/07/21     oxyCODONE (OXY IR/ROXICODONE) 5 MG immediate release tablet Take up to 1 tablet every 6 hours as needed only for severe postop pain 03/18/22     RSV vaccine recomb adjuvanted (AREXVY) 120 MCG/0.5ML injection Inject into the muscle. 03/13/22   Judyann Munson, MD  testosterone cypionate (DEPOTESTOSTERONE CYPIONATE) 200 MG/ML injection 1  ml    [provider]  testosterone cypionate (DEPOTESTOSTERONE CYPIONATE) 200 MG/ML injection Inject 1.5 mLs (300 mg total) into the muscle every 14 (fourteen) days. 05/07/22     tiZANidine (ZANAFLEX) 4 MG tablet Take 1 tablet (4 mg total) by mouth every 8 -12 hours as needed for pain and spasm. Patient not taking: Reported on 12/18/2020 07/29/20     traMADol (ULTRAM) 50 MG tablet Take 1-2 tablets (50-100 mg total) by mouth every 6 (six) hours as needed for pain 09/03/22     zolpidem (AMBIEN) 10 MG tablet Take 5 mg by mouth at bedtime as needed for sleep.    [provider]  zolpidem (AMBIEN) 10 MG tablet TAKE 1 TABLET BY MOUTH AT BEDTIME AS NEEDED FOR INSOMNIA FOR 30 DAYS 02/15/20 08/13/20  Noberto Retort, MD  zolpidem (AMBIEN) 10 MG tablet TAKE 1 (ONE) TABLET BY MOUTH AT BEDTIME AS NEEDED FOR INSOMNIA. 07/20/19 01/16/20  Noberto Retort, MD  zolpidem (AMBIEN) 10 MG tablet Take 1 tablet (10 mg total) by mouth at bedtime as needed for insomnia. 09/04/22     gabapentin (NEURONTIN) 300 MG capsule Take 1 capsule by mouth at bedtime for 3 days then 1 capsule twice daily for 3 days, then 1 capsule three times a day 02/25/21 07/04/21  Mack Hook, MD      Allergies    Prednisone    Review of Systems   Review of Systems  Neurological:  Positive for dizziness.    Physical Exam Updated Vital Signs BP 132/74 (BP Location: Right Arm)   Pulse 79   Temp 97.9 F (36.6 C) (Oral)   Resp 17   Ht 6' (1.829 m)    Wt 79.5 kg   SpO2 100%   BMI 23.77 kg/m  Physical Exam Vitals and nursing note reviewed.  Constitutional:      General: He is not in acute distress.    Appearance: He is well-developed.     Comments: Well-appearing, talking in full sentences   HENT:     Head: Normocephalic and atraumatic.  Eyes:     Extraocular Movements: Extraocular movements intact.     Conjunctiva/sclera: Conjunctivae normal.     Pupils: Pupils are equal, round, and reactive to light.  Comments: No nystagmus with EOM testing  Cardiovascular:     Rate and Rhythm: Normal rate and regular rhythm.     Heart sounds: No murmur heard. Pulmonary:     Effort: Pulmonary effort is normal. No respiratory distress.     Breath sounds: Normal breath sounds.  Abdominal:     Palpations: Abdomen is soft.     Tenderness: There is no abdominal tenderness.  Musculoskeletal:        General: No swelling.     Cervical back: Neck supple.  Skin:    General: Skin is warm and dry.     Capillary Refill: Capillary refill takes less than 2 seconds.     Comments: Abdomen surgical site covered with gauze that does have some serous drainage noted.  JP drain extending out with serous drainage.  Neurological:     Mental Status: He is alert.     Comments: Cranial nerves 3-12 intact Able to perform finger to nose testing bilaterally without difficulty No pronator drift of the upper or lower extremities bilaterally Able to ambulate without difficulty, with steady gait  Psychiatric:        Mood and Affect: Mood normal.     ED Results / Procedures / Treatments   Labs (all labs ordered are listed, but only abnormal results are displayed) Labs Reviewed  CBC WITH DIFFERENTIAL/PLATELET - Abnormal; Notable for the following components:      Result Value   WBC 12.5 (*)    Neutro Abs 10.6 (*)    Abs Immature Granulocytes 0.08 (*)    All other components within normal limits  COMPREHENSIVE METABOLIC PANEL - Abnormal; Notable for the  following components:   Sodium 133 (*)    Glucose, Bld 105 (*)    AST 14 (*)    Alkaline Phosphatase 24 (*)    All other components within normal limits  CBG MONITORING, ED - Abnormal; Notable for the following components:   Glucose-Capillary 118 (*)    All other components within normal limits    EKG EKG Interpretation Date/Time:  Saturday October 10 2022 10:23:25 EDT Ventricular Rate:  77 PR Interval:  192 QRS Duration:  98 QT Interval:  370 QTC Calculation: 418 R Axis:   25  Text Interpretation: Normal sinus rhythm Incomplete right bundle branch block Anterior infarct , age undetermined Abnormal ECG When compared with ECG of 09-Apr-2003 05:40, T wave amplitude has decreased in Anterior leads Confirmed by Estanislado Pandy [phone]) on 10/10/2022 1:14:48 PM  Radiology DG Chest Port 1 View  Result Date: 10/10/2022 CLINICAL DATA:  Dizziness EXAM: PORTABLE CHEST - 1 VIEW COMPARISON:  11/11/2020 FINDINGS: Surgical clips and drain left axilla. New left pleural effusion with some atelectasis/infiltrate at the left lung base. Right lung clear. Heart size normal. Aortic Atherosclerosis (ICD10-170.0). Visualized bones unremarkable. IMPRESSION: New left pleural effusion with left basilar atelectasis/infiltrate. Electronically Signed   By: Corlis Leak M.D.   On: 10/10/2022 13:00    Procedures Procedures    Medications Ordered in ED Medications  meclizine (ANTIVERT) tablet 25 mg (25 mg Oral Given 10/10/22 1128)    ED Course/ Medical Decision Making/ A&P                                 Medical Decision Making Amount and/or Complexity of Data Reviewed Labs: ordered. Radiology: ordered.   77 y.o. male with pertinent past medical history of  history of lobectomy at  [facility] on 09/24/2022 for adenocarcinoma of the left lung, malignant melanoma of torso recently removed at [facility], hypothyroidism, presents to the ED for concern of episodic dizziness since last night    Differential diagnosis includes  but is not limited to electrolyte abnormality, orthostatic hypotension, vertigo, Mnire's disease, vestibular neuritis, stroke, TIA  ED Course:  Patient overall very well-appearing with normal vital signs.  He is not currently dizzy sitting in bed.  States dizziness starts when he stands up and sometimes with changes in position.  He started new medications recently within the last week including metoprolol and amlodipine which he took last night.  Cranial nerves III through XII intact, no pronator drift, no weakness, no slurred speech, no facial droop, concern for TIA or stroke at this time.  I also reached out to Dr. Amada Jupiter with neurology to review the case.  He also feels no further imaging or workup is indicated from a neurology standpoint. Patient CMP with slight hyponatremia at 133, otherwise unremarkable.  CBC with slight leukocytosis of 12.5.  Patient was given meclizine after discussion of the case with Dr. Maple Hudson.  Upon reevaluation approximately 1.5 hour later, patient still reports some dizziness, slightly improved.  I did walk with the patient and he has steady gait, but reported slight dizziness when standing up. Suspect this is secondary to new metoprolol and amlodipine, possible orthostatic hypotension. Patient chest x-ray with pleural effusion of the left lower lung.  I discussed with the patient that he needs to follow-up very closely with his pulmonologist team at [facility] regarding this finding.  He does not have any indication for admission at this time.  No hypoxia, no shortness of breath.  Patient stable and appropriate for discharge home at this time.  Patient appropriate for discharge at this time  Impression: Episodic dizziness, likely secondary to new medications Left sided pleural effusion   Disposition:  The patient was discharged home with instructions to follow-up with his team at [facility] within the next week regarding medication management of his metoprolol and amlodipine  given this may be contributing to his dizziness.  Follow-up with his team within the next week regarding the left pleural effusion noted on chest x-ray. Return precautions given.  Lab Tests: I Ordered, and personally interpreted labs.  The pertinent results include:   CBC with leukocytosis at 12.5 CMP with hyponatremia 133  Imaging Studies ordered: I ordered imaging studies including chest x-ray I independently visualized the imaging with scope of interpretation limited to determining acute life threatening conditions related to emergency care. Imaging showed new left-sided pleural effusion with left basilar atelectasis I agree with the radiologist interpretation   Cardiac Monitoring: / EKG: The patient was maintained on a cardiac monitor.  I personally viewed and interpreted the cardiac monitored which showed an underlying rhythm of: Normal Sinus rhythm   External records from outside source obtained and reviewed including [facility] records from 09/24/2022 for lobectomy, chest x-ray readings from recent visit showing no pleural effusion at that time  Consults: I talked with neurologist Dr. Amada Jupiter who does not recommend any further workup from a neurology standpoint at this time. Feels his symptoms is likely medication related or orthostatic hypotension   Co morbidities that complicate the patient evaluation   history of lobectomy at [facility] on 09/24/2022 for adenocarcinoma of the left lung, malignant melanoma of torso recently removed at [facility], hypothyroidism,             Final Clinical Impression(s) / ED Diagnoses Final diagnoses:  Pleural effusion, left  Dizziness    Rx / DC Orders ED Discharge Orders     None         Arabella Merles, PA-C 10/10/22 1823    Coral Spikes, DO 10/11/22 [phone]

## 2022-10-10 NOTE — ED Triage Notes (Signed)
Patient ambulatory with steady gait to ED with dizziness that started last night. New medications of Norvasc and Metoprolol from recent surgery at [facility]. Denies chest pain, shortness of breath, numbness, tingling.

## 2022-10-13 ENCOUNTER — Other Ambulatory Visit (HOSPITAL_BASED_OUTPATIENT_CLINIC_OR_DEPARTMENT_OTHER): Payer: Self-pay | Admitting: Cardiothoracic Surgery

## 2022-10-13 DIAGNOSIS — C3412 Malignant neoplasm of upper lobe, left bronchus or lung: Secondary | ICD-10-CM

## 2022-10-14 ENCOUNTER — Ambulatory Visit (HOSPITAL_BASED_OUTPATIENT_CLINIC_OR_DEPARTMENT_OTHER)
Admission: RE | Admit: 2022-10-14 | Discharge: 2022-10-14 | Disposition: A | Discharge status: Home / Self Care | Payer: PPO | Source: Ambulatory Visit | Attending: Cardiothoracic Surgery | Admitting: Cardiothoracic Surgery

## 2022-10-14 DIAGNOSIS — C3412 Malignant neoplasm of upper lobe, left bronchus or lung: Secondary | ICD-10-CM | POA: Insufficient documentation

## 2022-10-14 DIAGNOSIS — R918 Other nonspecific abnormal finding of lung field: Secondary | ICD-10-CM | POA: Diagnosis not present

## 2022-10-14 DIAGNOSIS — Z48813 Encounter for surgical aftercare following surgery on the respiratory system: Secondary | ICD-10-CM | POA: Diagnosis not present

## 2022-10-21 DIAGNOSIS — E291 Testicular hypofunction: Secondary | ICD-10-CM | POA: Diagnosis not present

## 2022-10-27 DIAGNOSIS — J9811 Atelectasis: Secondary | ICD-10-CM | POA: Diagnosis not present

## 2022-10-27 DIAGNOSIS — J90 Pleural effusion, not elsewhere classified: Secondary | ICD-10-CM | POA: Diagnosis not present

## 2022-10-27 DIAGNOSIS — C3492 Malignant neoplasm of unspecified part of left bronchus or lung: Secondary | ICD-10-CM | POA: Diagnosis not present

## 2022-10-27 DIAGNOSIS — C4359 Malignant melanoma of other part of trunk: Secondary | ICD-10-CM | POA: Diagnosis not present

## 2022-11-04 DIAGNOSIS — E291 Testicular hypofunction: Secondary | ICD-10-CM | POA: Diagnosis not present

## 2022-11-13 DIAGNOSIS — M25511 Pain in right shoulder: Secondary | ICD-10-CM | POA: Diagnosis not present

## 2022-11-17 ENCOUNTER — Other Ambulatory Visit (HOSPITAL_COMMUNITY): Payer: Self-pay

## 2022-11-17 DIAGNOSIS — J918 Pleural effusion in other conditions classified elsewhere: Secondary | ICD-10-CM | POA: Diagnosis not present

## 2022-11-17 DIAGNOSIS — C3492 Malignant neoplasm of unspecified part of left bronchus or lung: Secondary | ICD-10-CM | POA: Diagnosis not present

## 2022-11-17 DIAGNOSIS — J90 Pleural effusion, not elsewhere classified: Secondary | ICD-10-CM | POA: Diagnosis not present

## 2022-11-17 DIAGNOSIS — J8410 Pulmonary fibrosis, unspecified: Secondary | ICD-10-CM | POA: Diagnosis not present

## 2022-11-17 DIAGNOSIS — J984 Other disorders of lung: Secondary | ICD-10-CM | POA: Diagnosis not present

## 2022-11-17 DIAGNOSIS — R490 Dysphonia: Secondary | ICD-10-CM | POA: Diagnosis not present

## 2022-11-17 MED ORDER — GABAPENTIN 300 MG PO CAPS
300.0000 mg | ORAL_CAPSULE | Freq: Three times a day (TID) | ORAL | 0 refills | Status: DC
Start: 2022-11-17 — End: 2023-02-24
  Filled 2022-11-17: qty 270, 90d supply, fill #0

## 2022-11-18 ENCOUNTER — Other Ambulatory Visit (HOSPITAL_COMMUNITY): Payer: Self-pay

## 2022-11-18 DIAGNOSIS — Z125 Encounter for screening for malignant neoplasm of prostate: Secondary | ICD-10-CM | POA: Diagnosis not present

## 2022-11-18 DIAGNOSIS — R7303 Prediabetes: Secondary | ICD-10-CM | POA: Diagnosis not present

## 2022-11-18 DIAGNOSIS — I10 Essential (primary) hypertension: Secondary | ICD-10-CM | POA: Diagnosis not present

## 2022-11-18 DIAGNOSIS — F419 Anxiety disorder, unspecified: Secondary | ICD-10-CM | POA: Diagnosis not present

## 2022-11-18 DIAGNOSIS — M792 Neuralgia and neuritis, unspecified: Secondary | ICD-10-CM | POA: Diagnosis not present

## 2022-11-18 DIAGNOSIS — G4700 Insomnia, unspecified: Secondary | ICD-10-CM | POA: Diagnosis not present

## 2022-11-18 DIAGNOSIS — E7800 Pure hypercholesterolemia, unspecified: Secondary | ICD-10-CM | POA: Diagnosis not present

## 2022-11-18 DIAGNOSIS — E291 Testicular hypofunction: Secondary | ICD-10-CM | POA: Diagnosis not present

## 2022-11-18 MED ORDER — GABAPENTIN 100 MG PO CAPS
100.0000 mg | ORAL_CAPSULE | Freq: Two times a day (BID) | ORAL | 5 refills | Status: DC
  Filled 2022-11-18: qty 60, 30d supply, fill #0
  Filled 2022-12-24: qty 60, 30d supply, fill #1

## 2022-11-26 ENCOUNTER — Other Ambulatory Visit (HOSPITAL_COMMUNITY): Payer: Self-pay

## 2022-11-30 ENCOUNTER — Encounter: Payer: Self-pay | Admitting: Internal Medicine

## 2022-12-01 ENCOUNTER — Encounter: Payer: Self-pay | Admitting: Physician Assistant

## 2022-12-02 DIAGNOSIS — E291 Testicular hypofunction: Secondary | ICD-10-CM | POA: Diagnosis not present

## 2022-12-09 ENCOUNTER — Other Ambulatory Visit: Payer: Self-pay | Admitting: Student in an Organized Health Care Education/Training Program

## 2022-12-09 DIAGNOSIS — Z85820 Personal history of malignant melanoma of skin: Secondary | ICD-10-CM | POA: Diagnosis not present

## 2022-12-09 DIAGNOSIS — C4359 Malignant melanoma of other part of trunk: Secondary | ICD-10-CM

## 2022-12-09 DIAGNOSIS — L905 Scar conditions and fibrosis of skin: Secondary | ICD-10-CM | POA: Diagnosis not present

## 2022-12-09 DIAGNOSIS — L570 Actinic keratosis: Secondary | ICD-10-CM | POA: Diagnosis not present

## 2022-12-09 DIAGNOSIS — X32XXXD Exposure to sunlight, subsequent encounter: Secondary | ICD-10-CM | POA: Diagnosis not present

## 2022-12-09 DIAGNOSIS — Z1283 Encounter for screening for malignant neoplasm of skin: Secondary | ICD-10-CM | POA: Diagnosis not present

## 2022-12-09 DIAGNOSIS — Z08 Encounter for follow-up examination after completed treatment for malignant neoplasm: Secondary | ICD-10-CM | POA: Diagnosis not present

## 2022-12-10 ENCOUNTER — Other Ambulatory Visit (HOSPITAL_COMMUNITY): Payer: Self-pay

## 2022-12-10 MED ORDER — ZOLPIDEM TARTRATE 10 MG PO TABS
10.0000 mg | ORAL_TABLET | Freq: Every evening | ORAL | 1 refills | Status: DC | PRN
Start: 2022-12-10 — End: 2023-04-09
  Filled 2022-12-10: qty 90, 90d supply, fill #0
  Filled 2023-03-08: qty 90, 90d supply, fill #1

## 2022-12-11 ENCOUNTER — Other Ambulatory Visit (HOSPITAL_COMMUNITY): Payer: Self-pay

## 2022-12-16 DIAGNOSIS — E291 Testicular hypofunction: Secondary | ICD-10-CM | POA: Diagnosis not present

## 2022-12-24 ENCOUNTER — Other Ambulatory Visit (HOSPITAL_COMMUNITY): Payer: Self-pay

## 2022-12-28 ENCOUNTER — Other Ambulatory Visit (HOSPITAL_COMMUNITY): Payer: Self-pay | Admitting: Student in an Organized Health Care Education/Training Program

## 2022-12-28 DIAGNOSIS — C439 Malignant melanoma of skin, unspecified: Secondary | ICD-10-CM

## 2022-12-31 DIAGNOSIS — E291 Testicular hypofunction: Secondary | ICD-10-CM | POA: Diagnosis not present

## 2023-01-05 ENCOUNTER — Ambulatory Visit
Admission: RE | Admit: 2023-01-05 | Discharge: 2023-01-05 | Disposition: A | Discharge status: Home / Self Care | Payer: PPO | Source: Ambulatory Visit | Attending: Student in an Organized Health Care Education/Training Program | Admitting: Student in an Organized Health Care Education/Training Program

## 2023-01-05 DIAGNOSIS — I6782 Cerebral ischemia: Secondary | ICD-10-CM | POA: Diagnosis not present

## 2023-01-05 DIAGNOSIS — C4359 Malignant melanoma of other part of trunk: Secondary | ICD-10-CM

## 2023-01-05 DIAGNOSIS — R911 Solitary pulmonary nodule: Secondary | ICD-10-CM | POA: Insufficient documentation

## 2023-01-05 DIAGNOSIS — C439 Malignant melanoma of skin, unspecified: Secondary | ICD-10-CM | POA: Diagnosis not present

## 2023-01-05 DIAGNOSIS — R918 Other nonspecific abnormal finding of lung field: Secondary | ICD-10-CM | POA: Diagnosis not present

## 2023-01-05 DIAGNOSIS — Q048 Other specified congenital malformations of brain: Secondary | ICD-10-CM | POA: Diagnosis not present

## 2023-01-05 DIAGNOSIS — C4362 Malignant melanoma of left upper limb, including shoulder: Secondary | ICD-10-CM | POA: Diagnosis not present

## 2023-01-05 LAB — GLUCOSE, CAPILLARY: Glucose-Capillary: 69 mg/dL — ABNORMAL LOW (ref 70–99)

## 2023-01-05 MED ORDER — FLUDEOXYGLUCOSE F - 18 (FDG) INJECTION
10.0000 | Freq: Once | INTRAVENOUS | Status: AC | PRN
  Administered 2023-01-05: 10 via INTRAVENOUS

## 2023-01-05 MED ORDER — GADOBUTROL 1 MMOL/ML IV SOLN
8.0000 mL | Freq: Once | INTRAVENOUS | Status: AC | PRN
  Administered 2023-01-05: 8 mL via INTRAVENOUS

## 2023-01-08 ENCOUNTER — Other Ambulatory Visit (HOSPITAL_COMMUNITY): Payer: Self-pay

## 2023-01-08 MED ORDER — METOPROLOL TARTRATE 25 MG PO TABS
25.0000 mg | ORAL_TABLET | Freq: Two times a day (BID) | ORAL | 11 refills | Status: DC
  Filled 2023-01-11: qty 60, 30d supply, fill #0
  Filled 2023-04-05: qty 60, 30d supply, fill #1
  Filled 2023-04-30: qty 60, 30d supply, fill #0
  Filled 2023-05-19: qty 60, 30d supply, fill #1

## 2023-01-11 ENCOUNTER — Other Ambulatory Visit (HOSPITAL_COMMUNITY): Payer: Self-pay

## 2023-01-13 ENCOUNTER — Other Ambulatory Visit (HOSPITAL_COMMUNITY): Payer: Self-pay

## 2023-01-13 MED ORDER — ALPRAZOLAM 0.5 MG PO TABS
0.2500 mg | ORAL_TABLET | Freq: Two times a day (BID) | ORAL | 0 refills | Status: DC
Start: 2023-01-13 — End: 2023-02-24
  Filled 2023-01-13: qty 180, 90d supply, fill #0

## 2023-01-14 ENCOUNTER — Other Ambulatory Visit (HOSPITAL_COMMUNITY): Payer: Self-pay

## 2023-01-14 MED ORDER — DULOXETINE HCL 30 MG PO CPEP
30.0000 mg | ORAL_CAPSULE | Freq: Every day | ORAL | 2 refills | Status: DC
  Filled 2023-01-14 (×2): qty 30, 30d supply, fill #0
  Filled 2023-04-01: qty 30, 30d supply, fill #1

## 2023-01-18 ENCOUNTER — Other Ambulatory Visit (HOSPITAL_COMMUNITY): Payer: Self-pay

## 2023-02-09 ENCOUNTER — Other Ambulatory Visit (HOSPITAL_COMMUNITY): Payer: Self-pay

## 2023-02-09 DIAGNOSIS — G8912 Acute post-thoracotomy pain: Secondary | ICD-10-CM | POA: Diagnosis not present

## 2023-02-09 MED ORDER — PREGABALIN 25 MG PO CAPS
25.0000 mg | ORAL_CAPSULE | Freq: Two times a day (BID) | ORAL | 2 refills | Status: DC
Start: 2023-02-09 — End: 2023-04-09
  Filled 2023-02-09: qty 60, 30d supply, fill #0

## 2023-02-24 ENCOUNTER — Ambulatory Visit: Payer: PPO | Admitting: Physician Assistant

## 2023-02-24 ENCOUNTER — Encounter: Payer: Self-pay | Admitting: Physician Assistant

## 2023-02-24 VITALS — BP 150/60 | HR 66 | Ht 72.0 in | Wt 176.0 lb

## 2023-02-24 DIAGNOSIS — Z09 Encounter for follow-up examination after completed treatment for conditions other than malignant neoplasm: Secondary | ICD-10-CM

## 2023-02-24 DIAGNOSIS — Z85118 Personal history of other malignant neoplasm of bronchus and lung: Secondary | ICD-10-CM | POA: Diagnosis not present

## 2023-02-24 DIAGNOSIS — Z902 Acquired absence of lung [part of]: Secondary | ICD-10-CM | POA: Diagnosis not present

## 2023-02-24 DIAGNOSIS — Z860101 Personal history of adenomatous and serrated colon polyps: Secondary | ICD-10-CM | POA: Diagnosis not present

## 2023-02-24 MED ORDER — SUFLAVE 178.7 G PO SOLR
1.0000 | Freq: Once | ORAL | 0 refills | Status: AC
Start: 2023-02-24 — End: 2023-02-24

## 2023-02-24 NOTE — Patient Instructions (Signed)
 You have been scheduled for a colonoscopy. Please follow written instructions given to you at your visit today.   If you use inhalers (even only as needed), please bring them with you on the day of your procedure.  DO NOT TAKE 7 DAYS PRIOR TO TEST- Trulicity (dulaglutide) Ozempic, Wegovy (semaglutide) Mounjaro (tirzepatide) Bydureon Bcise (exanatide extended release)  DO NOT TAKE 1 DAY PRIOR TO YOUR TEST Rybelsus (semaglutide) Adlyxin (lixisenatide) Victoza (liraglutide) Byetta (exanatide) ___________________________________________________________________________  Bonita Quin will receive your bowel preparation through [facility], which ensures the lowest copay and home delivery, with outreach via text or call from an [phone] number. Please respond promptly to avoid rescheduling of your procedure. If you are interested in alternative options or have any questions regarding your prep, please contact them at [phone] ____________________________________________________________________________  Your Provider Has Sent Your Bowel Prep Regimen To [facility]   [facility] will contact you to verify your information and collect your copay, if applicable. Enjoy the comfort of your home while your prescription is mailed to you, FREE of any shipping charges.   [facility] accepts all major insurance benefits and applies discounts & coupons.  Have additional questions?   Chat: [email or web address] Call: [phone] Email: [email or web address] NCPDP: [id number]  How will [facility] contact you?  With a Welcome phone call,  a Welcome text and a checkout link in text form.  Texts you receive from [phone] Are NOT Spam.  *To set up delivery, you must complete the checkout process via link or speak to one of the patient care representatives. If [facility] is unable to reach you, your prescription may be delayed.  To avoid long hold times on the phone, you may also utilize the secure chat  feature on the [facility] website to request that they call you back for transaction completion or to expedite your concerns.  Due to recent changes in healthcare laws, you may see the results of your imaging and laboratory studies on MyChart before your provider has had a chance to review them.  We understand that in some cases there may be results that are confusing or concerning to you. Not all laboratory results come back in the same time frame and the provider may be waiting for multiple results in order to interpret others.  Please give [place] 48 hours in order for your provider to thoroughly review all the results before contacting the office for clarification of your results.    _______________________________________________________  If your blood pressure at your visit was 140/90 or greater, please contact your primary care physician to follow up on this.  _______________________________________________________  If you are age 74 or older, your body mass index should be between 23-30. Your Body mass index is 23.87 kg/m. If this is out of the aforementioned range listed, please consider follow up with your Primary Care Provider.  If you are age 33 or younger, your body mass index should be between 19-25. Your Body mass index is 23.87 kg/m. If this is out of the aformentioned range listed, please consider follow up with your Primary Care Provider.   ________________________________________________________  The [facility] providers would like to encourage you to use [facility] to communicate with providers for non-urgent requests or questions.  Due to long hold times on the telephone, sending your provider a message by [facility] may be a faster and more efficient way to get a response.  Please allow 48 business hours for a response.  Please remember that this is for non-urgent requests.  _______________________________________________________ Thank you for trusting  me with your gastrointestinal  care!   Vicente Serene, PA

## 2023-02-24 NOTE — Progress Notes (Signed)
 Chief Complaint: Discuss colonoscopy  HPI:    Mr. Samuel Turner is a 78 year old male with past medical history as listed below including anxiety, lung cancer, PVCs (echo 01/07/2021 with LVEF 55-60%), GERD, depression and history of polyps, known to Dr. Rhea Belton, who was referred to me by Noberto Retort, MD for discussion of a colonoscopy given history of polyps.    12/17/2017 colonoscopy done for history of adenomatous polyps with one 4 mm polyp in the cecum, small internal hemorrhoids.  Pathology showed tubular adenoma.  Repeat recommended in 5 years.  (New guidelines would be 7 years)    10/10/2022 CMP with an alk phos 24, AST low.  Glucose 105 and sodium 133.    01/07/2023 PET scan with no evidence of recurrent or metastatic melanoma limited body.  Interval resection of left upper lobe pulmonary nodule and resection of left axillary metastatic lymph node.    02/09/2023 patient followed with pain medicine in regards to postthoracotomy pain.  Patient underwent thoracoscopic left upper lobectomy in September 2020 for persistent neuropathic pain which affects breathing and quality of life.  He is on Lyrica.    Today, the patient presents to clinic and tells me he is doing very well.  He would like to have 1 more colonoscopy.  He is not having any GI issues at all.  He did have lung cancer and had a partial lobectomy but is not on Oxygen.  Doing well otherwise other than some ongoing neuropathic pain.    Denies fever, chills or weight loss.  Past Medical History:  Diagnosis Date   Adjustment disorder with depressed mood    Allergic rhinitis    Anxiety    Arthritis    Colon polyps    Depression    Situational    GERD (gastroesophageal reflux disease)    H. pylori infection    Hypercholesterolemia    Hypothyroidism    Insomnia    Prediabetes    Sleep disorder    Squamous cell skin cancer    Testicular hypogonadism    Tubular adenoma 10/28/2007   Ventricular tachycardia (HCC)    6 yrs ago, no  issues now   Vitamin D deficiency     Past Surgical History:  Procedure Laterality Date   COLONOSCOPY     LEFT HEART CATH  2012   POLYPECTOMY     TONSILLECTOMY      Current Outpatient Medications  Medication Sig Dispense Refill   ALPRAZolam (XANAX) 0.5 MG tablet Take 0.5 mg by mouth at bedtime as needed for sleep.     aspirin 81 MG chewable tablet Chew 81 mg by mouth daily.     cholecalciferol (VITAMIN D) 1000 UNITS tablet Take 1,000 Units by mouth daily.     DULoxetine (CYMBALTA) 30 MG capsule Take 1 capsule (30 mg total) by mouth daily. 30 capsule 2   metoprolol tartrate (LOPRESSOR) 25 MG tablet Take 1 tablet (25 mg total) by mouth 2 (two) times daily with food. 60 tablet 11   Multiple Vitamin (MULTIVITAMIN) tablet Take 1 tablet by mouth daily.     naproxen (NAPROSYN) 250 MG tablet Take 250 mg by mouth 2 (two) times daily with a meal.     Omega-3 Fatty Acids (FISH OIL) 1000 MG CAPS Take 1 capsule by mouth daily.     pregabalin (LYRICA) 25 MG capsule Take 1 capsule (25 mg total) by mouth 2 (two) times daily. 60 capsule 2   testosterone cypionate (DEPOTESTOSTERONE CYPIONATE) 200 MG/ML injection 1  ml     zolpidem (AMBIEN) 10 MG tablet Take 5 mg by mouth at bedtime as needed for sleep.     Current Facility-Administered Medications  Medication Dose Route Frequency Provider Last Rate Last Admin   0.9 %  sodium chloride infusion  500 mL Intravenous Once Pyrtle, Carie Caddy, MD        Allergies as of 02/24/2023 - Review Complete 02/24/2023  Allergen Reaction Noted   Prednisone Swelling and Anxiety 07/25/2020    Family History  Problem Relation Age of Onset   Colon cancer Paternal Grandmother    Ovarian cancer Mother    Heart disease Father    Esophageal cancer Neg Hx    Stomach cancer Neg Hx    Rectal cancer Neg Hx     Social History   Socioeconomic History   Marital status: Married    Spouse name: Not on file   Number of children: 2   Years of education: Not on file    Highest education level: Not on file  Occupational History   Occupation: Financial planner: [employer]  Tobacco Use   Smoking status: Never   Smokeless tobacco: Never  Vaping Use   Vaping status: Never Used  Substance and Sexual Activity   Alcohol use: Not Currently   Drug use: No   Sexual activity: Not on file  Other Topics Concern   Not on file  Social History Narrative   Not on file   Social Drivers of Health   Financial Resource Strain: Low Risk  (09/28/2022)   Received from [facility]   Overall Financial Resource Strain (CARDIA)    Difficulty of Paying Living Expenses: Not hard at all  Food Insecurity: No Food Insecurity (09/28/2022)   Received from [facility]   Hunger Vital Sign    Worried About Running Out of Food in the Last Year: Never true    Ran Out of Food in the Last Year: Never true  Transportation Needs: No Transportation Needs (09/28/2022)   Received from [facility]   PRAPARE - Transportation    Lack of Transportation (Medical): No    Lack of Transportation (Non-Medical): No  Physical Activity: Not on file  Stress: Not on file  Social Connections: Not on file  Intimate Partner Violence: Not on file    Review of Systems:    Constitutional: No weight loss, fever or chills Skin: No rash or itching Cardiovascular: No chest pain, chest pressure or palpitations   Respiratory: No SOB or cough Gastrointestinal: See HPI and otherwise negative Genitourinary: No dysuria or change in urinary frequency Neurological: No headache, dizziness or syncope Musculoskeletal: No new muscle or joint pain Hematologic: No bleeding  Psychiatric: No history of depression or anxiety   Physical Exam:  Vital signs: BP (!) 150/60   Pulse 66   Ht 6' (1.829 m)   Wt 176 lb (79.8 kg)   BMI 23.87 kg/m    Constitutional:   Pleasant male appears to be in NAD, Well developed, Well nourished, alert and cooperative Head:  Normocephalic and  atraumatic. Eyes:   PEERL, EOMI. No icterus. Conjunctiva pink. Ears:  Normal auditory acuity. Neck:  Supple Throat: Oral cavity and pharynx without inflammation, swelling or lesion.  Respiratory: Respirations even and unlabored. Lungs clear to auscultation bilaterally.   No wheezes, crackles, or rhonchi.  Cardiovascular: Normal S1, S2. No MRG. Regular rate and rhythm. No peripheral edema, cyanosis or pallor.  Gastrointestinal:  Soft, nondistended,  nontender. No rebound or guarding. Normal bowel sounds. No appreciable masses or hepatomegaly. Rectal:  Not performed.  Msk:  Symmetrical without gross deformities. Without edema, no deformity or joint abnormality.  Neurologic:  Alert and  oriented x4;  grossly normal neurologically.  Skin:   Dry and intact without significant lesions or rashes. Psychiatric: Demonstrates good judgement and reason without abnormal affect or behaviors.  RELEVANT LABS AND IMAGING: CBC    Component Value Date/Time   WBC 12.5 (H) 10/10/2022 1034   RBC 4.51 10/10/2022 1034   HGB 14.8 10/10/2022 1034   HCT 42.9 10/10/2022 1034   PLT 342 10/10/2022 1034   MCV 95.1 10/10/2022 1034   MCH 32.8 10/10/2022 1034   MCHC 34.5 10/10/2022 1034   RDW 12.3 10/10/2022 1034   LYMPHSABS 0.9 10/10/2022 1034   MONOABS 0.8 10/10/2022 1034   EOSABS 0.0 10/10/2022 1034   BASOSABS 0.0 10/10/2022 1034    CMP     Component Value Date/Time   NA 133 (L) 10/10/2022 1034   K 4.2 10/10/2022 1034   CL 98 10/10/2022 1034   CO2 29 10/10/2022 1034   GLUCOSE 105 (H) 10/10/2022 1034   BUN 14 10/10/2022 1034   CREATININE 0.92 10/10/2022 1034   CALCIUM 9.1 10/10/2022 1034   PROT 6.5 10/10/2022 1034   ALBUMIN 3.8 10/10/2022 1034   AST 14 (L) 10/10/2022 1034   ALT 18 10/10/2022 1034   ALKPHOS 24 (L) 10/10/2022 1034   BILITOT 0.9 10/10/2022 1034   GFRNONAA >60 10/10/2022 1034    Assessment: 1.  History of adenomatous polyps: Last colonoscopy in 2019, repeat recommended in 5 years  (new guidelines would be 7 years), patient would like to proceed with one further colonoscopy 2.  Lung cancer status post partial lobectomy  Plan: 1.  Scheduled patient for a surveillance colonoscopy in the [facility] with Dr. Rhea Belton due to history of adenomatous polyps.  Did provide patient a detailed list of  risks for the procedure and he agrees to proceed. 2.  Patient to follow in clinic per recommendations after time of procedure.  Hyacinth Meeker, PA-C [facility] 02/24/2023, 8:54 AM  Cc: Noberto Retort, MD

## 2023-02-24 NOTE — Progress Notes (Signed)
 Addendum: Reviewed and agree with assessment and management plan. Asha Grumbine, Carie Caddy, MD

## 2023-02-26 ENCOUNTER — Other Ambulatory Visit (HOSPITAL_COMMUNITY): Payer: Self-pay

## 2023-02-26 MED ORDER — ZOLPIDEM TARTRATE 10 MG PO TABS
10.0000 mg | ORAL_TABLET | Freq: Every evening | ORAL | 1 refills | Status: AC | PRN
Start: 2023-02-26 — End: ?
  Filled 2023-06-04: qty 90, 90d supply, fill #0
  Filled 2023-07-10 – 2023-08-24 (×2): qty 90, 90d supply, fill #1
  Filled ????-??-?? (×2): fill #0

## 2023-03-01 ENCOUNTER — Other Ambulatory Visit (HOSPITAL_COMMUNITY): Payer: Self-pay

## 2023-03-08 ENCOUNTER — Other Ambulatory Visit: Payer: Self-pay

## 2023-03-10 ENCOUNTER — Other Ambulatory Visit (HOSPITAL_COMMUNITY): Payer: Self-pay

## 2023-03-10 DIAGNOSIS — R7303 Prediabetes: Secondary | ICD-10-CM | POA: Diagnosis not present

## 2023-03-10 DIAGNOSIS — E291 Testicular hypofunction: Secondary | ICD-10-CM | POA: Diagnosis not present

## 2023-03-10 DIAGNOSIS — Z125 Encounter for screening for malignant neoplasm of prostate: Secondary | ICD-10-CM | POA: Diagnosis not present

## 2023-03-10 DIAGNOSIS — E039 Hypothyroidism, unspecified: Secondary | ICD-10-CM | POA: Diagnosis not present

## 2023-03-11 ENCOUNTER — Other Ambulatory Visit (HOSPITAL_COMMUNITY): Payer: Self-pay

## 2023-03-11 DIAGNOSIS — G8912 Acute post-thoracotomy pain: Secondary | ICD-10-CM | POA: Diagnosis not present

## 2023-03-11 MED ORDER — PREGABALIN 50 MG PO CAPS
50.0000 mg | ORAL_CAPSULE | Freq: Two times a day (BID) | ORAL | 2 refills | Status: DC
Start: 2023-03-11 — End: 2023-03-18
  Filled 2023-03-11: qty 60, 30d supply, fill #0

## 2023-03-16 ENCOUNTER — Other Ambulatory Visit (HOSPITAL_COMMUNITY): Payer: Self-pay

## 2023-03-16 MED ORDER — IRBESARTAN 150 MG PO TABS
150.0000 mg | ORAL_TABLET | Freq: Every day | ORAL | 5 refills | Status: DC
  Filled 2023-03-16: qty 30, 30d supply, fill #0

## 2023-03-18 ENCOUNTER — Other Ambulatory Visit (HOSPITAL_COMMUNITY): Payer: Self-pay

## 2023-03-18 DIAGNOSIS — G8912 Acute post-thoracotomy pain: Secondary | ICD-10-CM | POA: Diagnosis not present

## 2023-03-18 MED ORDER — PREGABALIN 75 MG PO CAPS
75.0000 mg | ORAL_CAPSULE | Freq: Two times a day (BID) | ORAL | 0 refills | Status: DC
Start: 2023-03-18 — End: 2023-04-09
  Filled 2023-03-18: qty 60, 30d supply, fill #0

## 2023-03-24 DIAGNOSIS — Z125 Encounter for screening for malignant neoplasm of prostate: Secondary | ICD-10-CM | POA: Diagnosis not present

## 2023-03-24 DIAGNOSIS — E291 Testicular hypofunction: Secondary | ICD-10-CM | POA: Diagnosis not present

## 2023-03-24 DIAGNOSIS — I10 Essential (primary) hypertension: Secondary | ICD-10-CM | POA: Diagnosis not present

## 2023-03-25 ENCOUNTER — Other Ambulatory Visit (HOSPITAL_COMMUNITY): Payer: Self-pay

## 2023-03-25 DIAGNOSIS — I10 Essential (primary) hypertension: Secondary | ICD-10-CM | POA: Diagnosis not present

## 2023-03-25 DIAGNOSIS — R0989 Other specified symptoms and signs involving the circulatory and respiratory systems: Secondary | ICD-10-CM | POA: Diagnosis not present

## 2023-03-25 MED ORDER — ALBUTEROL SULFATE HFA 108 (90 BASE) MCG/ACT IN AERS
1.0000 | INHALATION_SPRAY | RESPIRATORY_TRACT | 0 refills | Status: DC | PRN
  Filled 2023-03-25: qty 6.7, 25d supply, fill #0

## 2023-03-25 MED ORDER — AZITHROMYCIN 250 MG PO TABS
ORAL_TABLET | ORAL | 0 refills | Status: AC
  Filled 2023-03-25: qty 6, 5d supply, fill #0

## 2023-03-25 MED ORDER — IRBESARTAN 300 MG PO TABS
300.0000 mg | ORAL_TABLET | Freq: Every day | ORAL | 5 refills | Status: DC
  Filled 2023-03-25 – 2023-04-30 (×2): qty 30, 30d supply, fill #0
  Filled 2023-06-13: qty 30, 30d supply, fill #1

## 2023-03-29 ENCOUNTER — Other Ambulatory Visit (HOSPITAL_COMMUNITY): Payer: Self-pay

## 2023-04-01 ENCOUNTER — Encounter: Payer: Self-pay | Admitting: Internal Medicine

## 2023-04-02 DIAGNOSIS — R079 Chest pain, unspecified: Secondary | ICD-10-CM | POA: Diagnosis not present

## 2023-04-02 DIAGNOSIS — Z08 Encounter for follow-up examination after completed treatment for malignant neoplasm: Secondary | ICD-10-CM | POA: Diagnosis not present

## 2023-04-02 DIAGNOSIS — C3492 Malignant neoplasm of unspecified part of left bronchus or lung: Secondary | ICD-10-CM | POA: Diagnosis not present

## 2023-04-02 DIAGNOSIS — C4359 Malignant melanoma of other part of trunk: Secondary | ICD-10-CM | POA: Diagnosis not present

## 2023-04-02 DIAGNOSIS — Z85118 Personal history of other malignant neoplasm of bronchus and lung: Secondary | ICD-10-CM | POA: Diagnosis not present

## 2023-04-02 DIAGNOSIS — Z902 Acquired absence of lung [part of]: Secondary | ICD-10-CM | POA: Diagnosis not present

## 2023-04-02 DIAGNOSIS — R0602 Shortness of breath: Secondary | ICD-10-CM | POA: Diagnosis not present

## 2023-04-02 DIAGNOSIS — G629 Polyneuropathy, unspecified: Secondary | ICD-10-CM | POA: Diagnosis not present

## 2023-04-02 DIAGNOSIS — I2510 Atherosclerotic heart disease of native coronary artery without angina pectoris: Secondary | ICD-10-CM | POA: Diagnosis not present

## 2023-04-02 DIAGNOSIS — M25511 Pain in right shoulder: Secondary | ICD-10-CM | POA: Diagnosis not present

## 2023-04-07 DIAGNOSIS — E291 Testicular hypofunction: Secondary | ICD-10-CM | POA: Diagnosis not present

## 2023-04-09 ENCOUNTER — Ambulatory Visit: Payer: PPO | Admitting: Internal Medicine

## 2023-04-09 ENCOUNTER — Encounter: Payer: Self-pay | Admitting: Internal Medicine

## 2023-04-09 VITALS — BP 111/54 | HR 57 | Temp 98.3°F | Resp 12 | Ht 72.0 in | Wt 176.0 lb

## 2023-04-09 DIAGNOSIS — K648 Other hemorrhoids: Secondary | ICD-10-CM | POA: Diagnosis not present

## 2023-04-09 DIAGNOSIS — E039 Hypothyroidism, unspecified: Secondary | ICD-10-CM | POA: Diagnosis not present

## 2023-04-09 DIAGNOSIS — F419 Anxiety disorder, unspecified: Secondary | ICD-10-CM | POA: Diagnosis not present

## 2023-04-09 DIAGNOSIS — G4730 Sleep apnea, unspecified: Secondary | ICD-10-CM | POA: Diagnosis not present

## 2023-04-09 DIAGNOSIS — Z1211 Encounter for screening for malignant neoplasm of colon: Secondary | ICD-10-CM | POA: Diagnosis not present

## 2023-04-09 DIAGNOSIS — Z860101 Personal history of adenomatous and serrated colon polyps: Secondary | ICD-10-CM

## 2023-04-09 DIAGNOSIS — E7800 Pure hypercholesterolemia, unspecified: Secondary | ICD-10-CM | POA: Diagnosis not present

## 2023-04-09 MED ORDER — SODIUM CHLORIDE 0.9 % IV SOLN: 500.0000 mL | Freq: Once | INTRAVENOUS | Status: DC

## 2023-04-09 NOTE — Op Note (Signed)
 [facility] Patient Name: Samuel Turner Procedure Date: 04/09/2023 3:19 PM MRN: [id number] Endoscopist: Beverley Fiedler , MD, [id number] Age: 78 Referring MD:  Date of Birth: February 26, 1945 Gender: Male Account #: [id number] Procedure:                Colonoscopy Indications:              High risk colon cancer surveillance: Personal                            history of non-advanced adenoma, Last colonoscopy:                            December 2019 (TA x 1), Oct 2014 (no polyps), Dec                            2009 (TA x 1) Medicines:                Monitored Anesthesia Care Procedure:                Pre-Anesthesia Assessment:                           - Prior to the procedure, a History and Physical                            was performed, and patient medications and                            allergies were reviewed. The patient's tolerance of                            previous anesthesia was also reviewed. The risks                            and benefits of the procedure and the sedation                            options and risks were discussed with the patient.                            All questions were answered, and informed consent                            was obtained. Prior Anticoagulants: The patient has                            taken no anticoagulant or antiplatelet agents. ASA                            Grade Assessment: III - A patient with severe                            systemic disease. After reviewing the risks and  benefits, the patient was deemed in satisfactory                            condition to undergo the procedure.                           After obtaining informed consent, the colonoscope                            was passed under direct vision. Throughout the                            procedure, the patient's blood pressure, pulse, and                            oxygen saturations were monitored continuously.  The                            Olympus Scope SN: [id number] was introduced through                            the anus and advanced to the terminal ileum. The                            colonoscopy was performed without difficulty. The                            patient tolerated the procedure well. The quality                            of the bowel preparation was good. The terminal                            ileum, ileocecal valve, appendiceal orifice, and                            rectum were photographed. Scope In: 3:35:19 PM Scope Out: 3:48:47 PM Scope Withdrawal Time: 0 hours 9 minutes 56 seconds  Total Procedure Duration: 0 hours 13 minutes 28 seconds  Findings:                 The digital rectal exam was normal.                           The terminal ileum appeared normal.                           The colon (entire examined portion) appeared normal.                           Internal hemorrhoids were found during                            retroflexion. The hemorrhoids were small. Complications:            No immediate  complications. Estimated Blood Loss:     Estimated blood loss: none. Impression:               - The examined portion of the ileum was normal.                           - The entire examined colon is normal.                           - Small internal hemorrhoids.                           - No specimens collected. Recommendation:           - Patient has a contact number available for                            emergencies. The signs and symptoms of potential                            delayed complications were discussed with the                            patient. Return to normal activities tomorrow.                            Written discharge instructions were provided to the                            patient.                           - Resume previous diet.                           - Continue present medications.                           - No  recommendation at this time regarding repeat                            colonoscopy due to age and no polyps on today's                            exam (next surveillance interval would be 10 years                            from today). Beverley Fiedler, MD 04/09/2023 3:51:01 PM This report has been signed electronically.

## 2023-04-09 NOTE — Progress Notes (Signed)
 GASTROENTEROLOGY PROCEDURE H&P NOTE   Primary Care Physician: Noberto Retort, MD    Reason for Procedure:   Hx of polyps  Plan:    colonoscopy  Patient is appropriate for endoscopic procedure(s) in the ambulatory ([facility]) setting.  The nature of the procedure, as well as the risks, benefits, and alternatives were carefully and thoroughly reviewed with the patient. Ample time for discussion and questions allowed. The patient understood, was satisfied, and agreed to proceed.     HPI: Samuel Turner is a 78 y.o. male who presents for surveillance colonoscopy.  Medical history as below.  Tolerated the prep.  No recent chest pain or shortness of breath.  No abdominal pain today.  Past Medical History:  Diagnosis Date   Adjustment disorder with depressed mood    Allergic rhinitis    Anxiety    Arthritis    Colon polyps    Depression    Situational    GERD (gastroesophageal reflux disease)    H. pylori infection    Hypercholesterolemia    Hypothyroidism    Insomnia    Prediabetes    Sleep disorder    Squamous cell skin cancer    Testicular hypogonadism    Tubular adenoma 10/28/2007   Ventricular tachycardia (HCC)    6 yrs ago, no issues now   Vitamin D deficiency     Past Surgical History:  Procedure Laterality Date   COLONOSCOPY     LEFT HEART CATH  2012   POLYPECTOMY     TONSILLECTOMY      Prior to Admission medications   Medication Sig Start Date End Date Taking? Authorizing Provider  acetaminophen (TYLENOL) 500 MG tablet Take 500 mg by mouth.   Yes [provider]  albuterol (VENTOLIN HFA) 108 (90 Base) MCG/ACT inhaler Inhale 1-2 puffs into the lungs every 4-6 (four to six) hours as needed for shortness of breath 03/25/23  Yes   ALPRAZolam (XANAX) 0.5 MG tablet Take 0.5 mg by mouth at bedtime as needed for sleep.   Yes [provider]  aspirin 81 MG chewable tablet Chew 81 mg by mouth daily.   Yes [provider]  Cholecalciferol  50 MCG (2000 UT) CAPS 1 tablet Orally Once a day   Yes [provider]  irbesartan (AVAPRO) 300 MG tablet Take 1 tablet (300 mg total) by mouth daily. 03/25/23  Yes   metoprolol tartrate (LOPRESSOR) 25 MG tablet Take 1 tablet (25 mg total) by mouth 2 (two) times daily with food. 01/08/23  Yes   Multiple Vitamin (MULTIVITAMIN) tablet Take 1 tablet by mouth daily.   Yes [provider]  Omega 3 340 MG CPDR Omega 3   Yes [provider]  testosterone cypionate (DEPOTESTOSTERONE CYPIONATE) 200 MG/ML injection 1  ml   Yes [provider]  zolpidem (AMBIEN) 10 MG tablet Take 1 tablet (10 mg total) by mouth at bedtime as needed for insomnia. 02/26/23  Yes   zolpidem (AMBIEN) 10 MG tablet Take 5 mg by mouth at bedtime as needed for sleep.    [provider]    Current Outpatient Medications  Medication Sig Dispense Refill   acetaminophen (TYLENOL) 500 MG tablet Take 500 mg by mouth.     albuterol (VENTOLIN HFA) 108 (90 Base) MCG/ACT inhaler Inhale 1-2 puffs into the lungs every 4-6 (four to six) hours as needed for shortness of breath 6.7 g 0   ALPRAZolam (XANAX) 0.5 MG tablet Take 0.5 mg by mouth  at bedtime as needed for sleep.     aspirin 81 MG chewable tablet Chew 81 mg by mouth daily.     Cholecalciferol 50 MCG (2000 UT) CAPS 1 tablet Orally Once a day     irbesartan (AVAPRO) 300 MG tablet Take 1 tablet (300 mg total) by mouth daily. 30 tablet 5   metoprolol tartrate (LOPRESSOR) 25 MG tablet Take 1 tablet (25 mg total) by mouth 2 (two) times daily with food. 60 tablet 11   Multiple Vitamin (MULTIVITAMIN) tablet Take 1 tablet by mouth daily.     Omega 3 340 MG CPDR Omega 3     testosterone cypionate (DEPOTESTOSTERONE CYPIONATE) 200 MG/ML injection 1  ml     zolpidem (AMBIEN) 10 MG tablet Take 1 tablet (10 mg total) by mouth at bedtime as needed for insomnia. 90 tablet 1   zolpidem (AMBIEN) 10 MG tablet Take 5 mg by mouth at bedtime as needed for sleep.      Current Facility-Administered Medications  Medication Dose Route Frequency Provider Last Rate Last Admin   0.9 %  sodium chloride infusion  500 mL Intravenous Once Matson Welch, Carie Caddy, MD        Allergies as of 04/09/2023 - Review Complete 04/09/2023  Allergen Reaction Noted   Corticosteroids Other (See Comments) 04/09/2023   Prednisone Swelling and Anxiety 07/25/2020    Family History  Problem Relation Age of Onset   Colon cancer Paternal Grandmother    Ovarian cancer Mother    Heart disease Father    Esophageal cancer Neg Hx    Stomach cancer Neg Hx    Rectal cancer Neg Hx     Social History   Socioeconomic History   Marital status: Married    Spouse name: Not on file   Number of children: 2   Years of education: Not on file   Highest education level: Not on file  Occupational History   Occupation: Financial planner: [employer]  Tobacco Use   Smoking status: Never   Smokeless tobacco: Never  Vaping Use   Vaping status: Never Used  Substance and Sexual Activity   Alcohol use: Yes    Alcohol/week: 1.0 standard drink of alcohol    Types: 1 Glasses of wine per week   Drug use: No   Sexual activity: Not on file  Other Topics Concern   Not on file  Social History Narrative   Not on file   Social Drivers of Health   Financial Resource Strain: Low Risk  (09/28/2022)   Received from [facility]   Overall Financial Resource Strain (CARDIA)    Difficulty of Paying Living Expenses: Not hard at all  Food Insecurity: No Food Insecurity (04/02/2023)   Received from [facility]   Hunger Vital Sign    Worried About Running Out of Food in the Last Year: Never true    Ran Out of Food in the Last Year: Never true  Transportation Needs: No Transportation Needs (04/02/2023)   Received from [facility] - Transportation    Lack of Transportation (Medical): No    Lack of Transportation (Non-Medical): No  Physical Activity: Not on file   Stress: Not on file  Social Connections: Not on file  Intimate Partner Violence: Not on file    Physical Exam: Vital signs in last 24 hours: @BP  (!) 146/72   Pulse 69   Temp 98.3 F (36.8 C) (Temporal)   Ht 6' (  1.829 m)   Wt 176 lb (79.8 kg)   SpO2 99%   BMI 23.87 kg/m  GEN: NAD EYE: Sclerae anicteric ENT: MMM CV: Non-tachycardic Pulm: CTA b/l GI: Soft, NT/ND NEURO:  Alert & Oriented x 3   Erick Blinks, MD [facility]  04/09/2023 3:22 PM

## 2023-04-09 NOTE — Patient Instructions (Signed)
 Handout provided on hemorrhoids.  Resume previous diet.  Continue present medications.  No recommendation at this time regarding repeat colonoscopy due to age and not polyps on today's exam (next surveillance interval would be 10 years from today).  YOU HAD AN ENDOSCOPIC PROCEDURE TODAY AT THE [facility]:   Refer to the procedure report that was given to you for any specific questions about what was found during the examination.  If the procedure report does not answer your questions, please call your gastroenterologist to clarify.  If you requested that your care partner not be given the details of your procedure findings, then the procedure report has been included in a sealed envelope for you to review at your convenience later.  YOU SHOULD EXPECT: Some feelings of bloating in the abdomen. Passage of more gas than usual.  Walking can help get rid of the air that was put into your GI tract during the procedure and reduce the bloating. If you had a lower endoscopy (such as a colonoscopy or flexible sigmoidoscopy) you may notice spotting of blood in your stool or on the toilet paper. If you underwent a bowel prep for your procedure, you may not have a normal bowel movement for a few days.  Please Note:  You might notice some irritation and congestion in your nose or some drainage.  This is from the oxygen used during your procedure.  There is no need for concern and it should clear up in a day or so.  SYMPTOMS TO REPORT IMMEDIATELY:  Following lower endoscopy (colonoscopy or flexible sigmoidoscopy):  Excessive amounts of blood in the stool  Significant tenderness or worsening of abdominal pains  Swelling of the abdomen that is new, acute  Fever of 100F or higher  For urgent or emergent issues, a gastroenterologist can be reached at any hour by calling [phone]. Do not use MyChart messaging for urgent concerns.    DIET:  We do recommend a small meal at first, but then you  may proceed to your regular diet.  Drink plenty of fluids but you should avoid alcoholic beverages for 24 hours.  ACTIVITY:  You should plan to take it easy for the rest of today and you should NOT DRIVE or use heavy machinery until tomorrow (because of the sedation medicines used during the test).    FOLLOW UP: Our staff will call the number listed on your records the next business day following your procedure.  We will call around 7:15- 8:00 am to check on you and address any questions or concerns that you may have regarding the information given to you following your procedure. If we do not reach you, we will leave a message.     If any biopsies were taken you will be contacted by phone or by letter within the next 1-3 weeks.  Please call us at [phone] if you have not heard about the biopsies in 3 weeks.    SIGNATURES/CONFIDENTIALITY: You and/or your care partner have signed paperwork which will be entered into your electronic medical record.  These signatures attest to the fact that that the information above on your After Visit Summary has been reviewed and is understood.  Full responsibility of the confidentiality of this discharge information lies with you and/or your care-partner.

## 2023-04-09 NOTE — Progress Notes (Signed)
 A/O x 3, gd SR's, VSS, report to RNA/O x 3, gd SR's, VSS, report to RN

## 2023-04-12 ENCOUNTER — Telehealth: Payer: Self-pay | Admitting: *Deleted

## 2023-04-12 NOTE — Telephone Encounter (Signed)
  Follow up Call-     04/09/2023    2:20 PM  Call back number  Post procedure Call Back phone  # [phone]  Permission to leave phone message Yes     Patient questions:  Do you have a fever, pain , or abdominal swelling? No. Pain Score  0 *  Have you tolerated food without any problems? Yes.    Have you been able to return to your normal activities? Yes.    Do you have any questions about your discharge instructions: Diet   No. Medications  No. Follow up visit  No.  Do you have questions or concerns about your Care? No.  Actions: * If pain score is 4 or above: No action needed, pain <4.

## 2023-04-15 DIAGNOSIS — G8912 Acute post-thoracotomy pain: Secondary | ICD-10-CM | POA: Diagnosis not present

## 2023-04-19 ENCOUNTER — Other Ambulatory Visit (HOSPITAL_COMMUNITY): Payer: Self-pay

## 2023-04-19 MED ORDER — AMITRIPTYLINE HCL 25 MG PO TABS
25.0000 mg | ORAL_TABLET | Freq: Every day | ORAL | 5 refills | Status: DC
  Filled 2023-04-19: qty 30, 30d supply, fill #0

## 2023-04-20 DIAGNOSIS — E291 Testicular hypofunction: Secondary | ICD-10-CM | POA: Diagnosis not present

## 2023-04-20 DIAGNOSIS — M25511 Pain in right shoulder: Secondary | ICD-10-CM | POA: Diagnosis not present

## 2023-04-26 ENCOUNTER — Other Ambulatory Visit (HOSPITAL_COMMUNITY): Payer: Self-pay | Admitting: Student in an Organized Health Care Education/Training Program

## 2023-04-26 ENCOUNTER — Encounter: Payer: Self-pay | Admitting: Student in an Organized Health Care Education/Training Program

## 2023-04-26 DIAGNOSIS — R519 Headache, unspecified: Secondary | ICD-10-CM

## 2023-04-26 DIAGNOSIS — C4359 Malignant melanoma of other part of trunk: Secondary | ICD-10-CM

## 2023-04-26 DIAGNOSIS — R42 Dizziness and giddiness: Secondary | ICD-10-CM

## 2023-04-28 ENCOUNTER — Other Ambulatory Visit (HOSPITAL_COMMUNITY): Payer: Self-pay

## 2023-04-28 DIAGNOSIS — G8912 Acute post-thoracotomy pain: Secondary | ICD-10-CM | POA: Diagnosis not present

## 2023-04-30 ENCOUNTER — Other Ambulatory Visit (HOSPITAL_COMMUNITY): Payer: Self-pay

## 2023-04-30 ENCOUNTER — Ambulatory Visit (HOSPITAL_BASED_OUTPATIENT_CLINIC_OR_DEPARTMENT_OTHER)
Admission: RE | Admit: 2023-04-30 | Discharge: 2023-04-30 | Disposition: A | Discharge status: Home / Self Care | Source: Ambulatory Visit | Attending: Student in an Organized Health Care Education/Training Program | Admitting: Student in an Organized Health Care Education/Training Program

## 2023-04-30 ENCOUNTER — Other Ambulatory Visit (HOSPITAL_BASED_OUTPATIENT_CLINIC_OR_DEPARTMENT_OTHER): Payer: Self-pay

## 2023-04-30 DIAGNOSIS — R42 Dizziness and giddiness: Secondary | ICD-10-CM | POA: Diagnosis not present

## 2023-04-30 DIAGNOSIS — C4359 Malignant melanoma of other part of trunk: Secondary | ICD-10-CM | POA: Diagnosis not present

## 2023-04-30 DIAGNOSIS — Z85118 Personal history of other malignant neoplasm of bronchus and lung: Secondary | ICD-10-CM | POA: Diagnosis not present

## 2023-04-30 DIAGNOSIS — R519 Headache, unspecified: Secondary | ICD-10-CM | POA: Diagnosis not present

## 2023-04-30 DIAGNOSIS — Z85820 Personal history of malignant melanoma of skin: Secondary | ICD-10-CM | POA: Diagnosis not present

## 2023-04-30 MED ORDER — GADOBUTROL 1 MMOL/ML IV SOLN
7.5000 mL | Freq: Once | INTRAVENOUS | Status: AC | PRN
  Administered 2023-04-30: 7.5 mL via INTRAVENOUS
  Filled 2023-04-30: qty 7.5

## 2023-05-04 ENCOUNTER — Other Ambulatory Visit (HOSPITAL_COMMUNITY): Payer: Self-pay

## 2023-05-04 DIAGNOSIS — M6281 Muscle weakness (generalized): Secondary | ICD-10-CM | POA: Diagnosis not present

## 2023-05-04 DIAGNOSIS — M7541 Impingement syndrome of right shoulder: Secondary | ICD-10-CM | POA: Diagnosis not present

## 2023-05-04 DIAGNOSIS — C4359 Malignant melanoma of other part of trunk: Secondary | ICD-10-CM | POA: Diagnosis not present

## 2023-05-04 DIAGNOSIS — C3412 Malignant neoplasm of upper lobe, left bronchus or lung: Secondary | ICD-10-CM | POA: Diagnosis not present

## 2023-05-04 MED ORDER — ALPRAZOLAM 0.5 MG PO TABS
0.2500 mg | ORAL_TABLET | Freq: Two times a day (BID) | ORAL | 0 refills | Status: DC
  Filled 2023-05-04: qty 180, 90d supply, fill #0

## 2023-05-05 ENCOUNTER — Other Ambulatory Visit (HOSPITAL_COMMUNITY): Payer: Self-pay

## 2023-05-05 DIAGNOSIS — E291 Testicular hypofunction: Secondary | ICD-10-CM | POA: Diagnosis not present

## 2023-05-05 DIAGNOSIS — E875 Hyperkalemia: Secondary | ICD-10-CM | POA: Diagnosis not present

## 2023-05-05 DIAGNOSIS — R0789 Other chest pain: Secondary | ICD-10-CM | POA: Diagnosis not present

## 2023-05-05 MED ORDER — TRAMADOL HCL 50 MG PO TABS
25.0000 mg | ORAL_TABLET | Freq: Two times a day (BID) | ORAL | 5 refills | Status: DC
  Filled 2023-05-05 – 2023-06-02 (×2): qty 30, 30d supply, fill #0
  Filled 2023-06-28: qty 30, 30d supply, fill #1
  Filled 2023-07-10 – 2023-08-02 (×3): qty 30, 30d supply, fill #2
  Filled 2023-08-24: qty 30, 30d supply, fill #3
  Filled ????-??-??: fill #3
  Filled ????-??-??: fill #2

## 2023-05-12 IMAGING — CR DG CHEST 2V
2 series · 2 of 2 positions shown · non-contrast
Comparison: October 03, 2019.

CLINICAL DATA: Fatigue.

EXAM:
CHEST - 2 VIEW

[w chest pa]
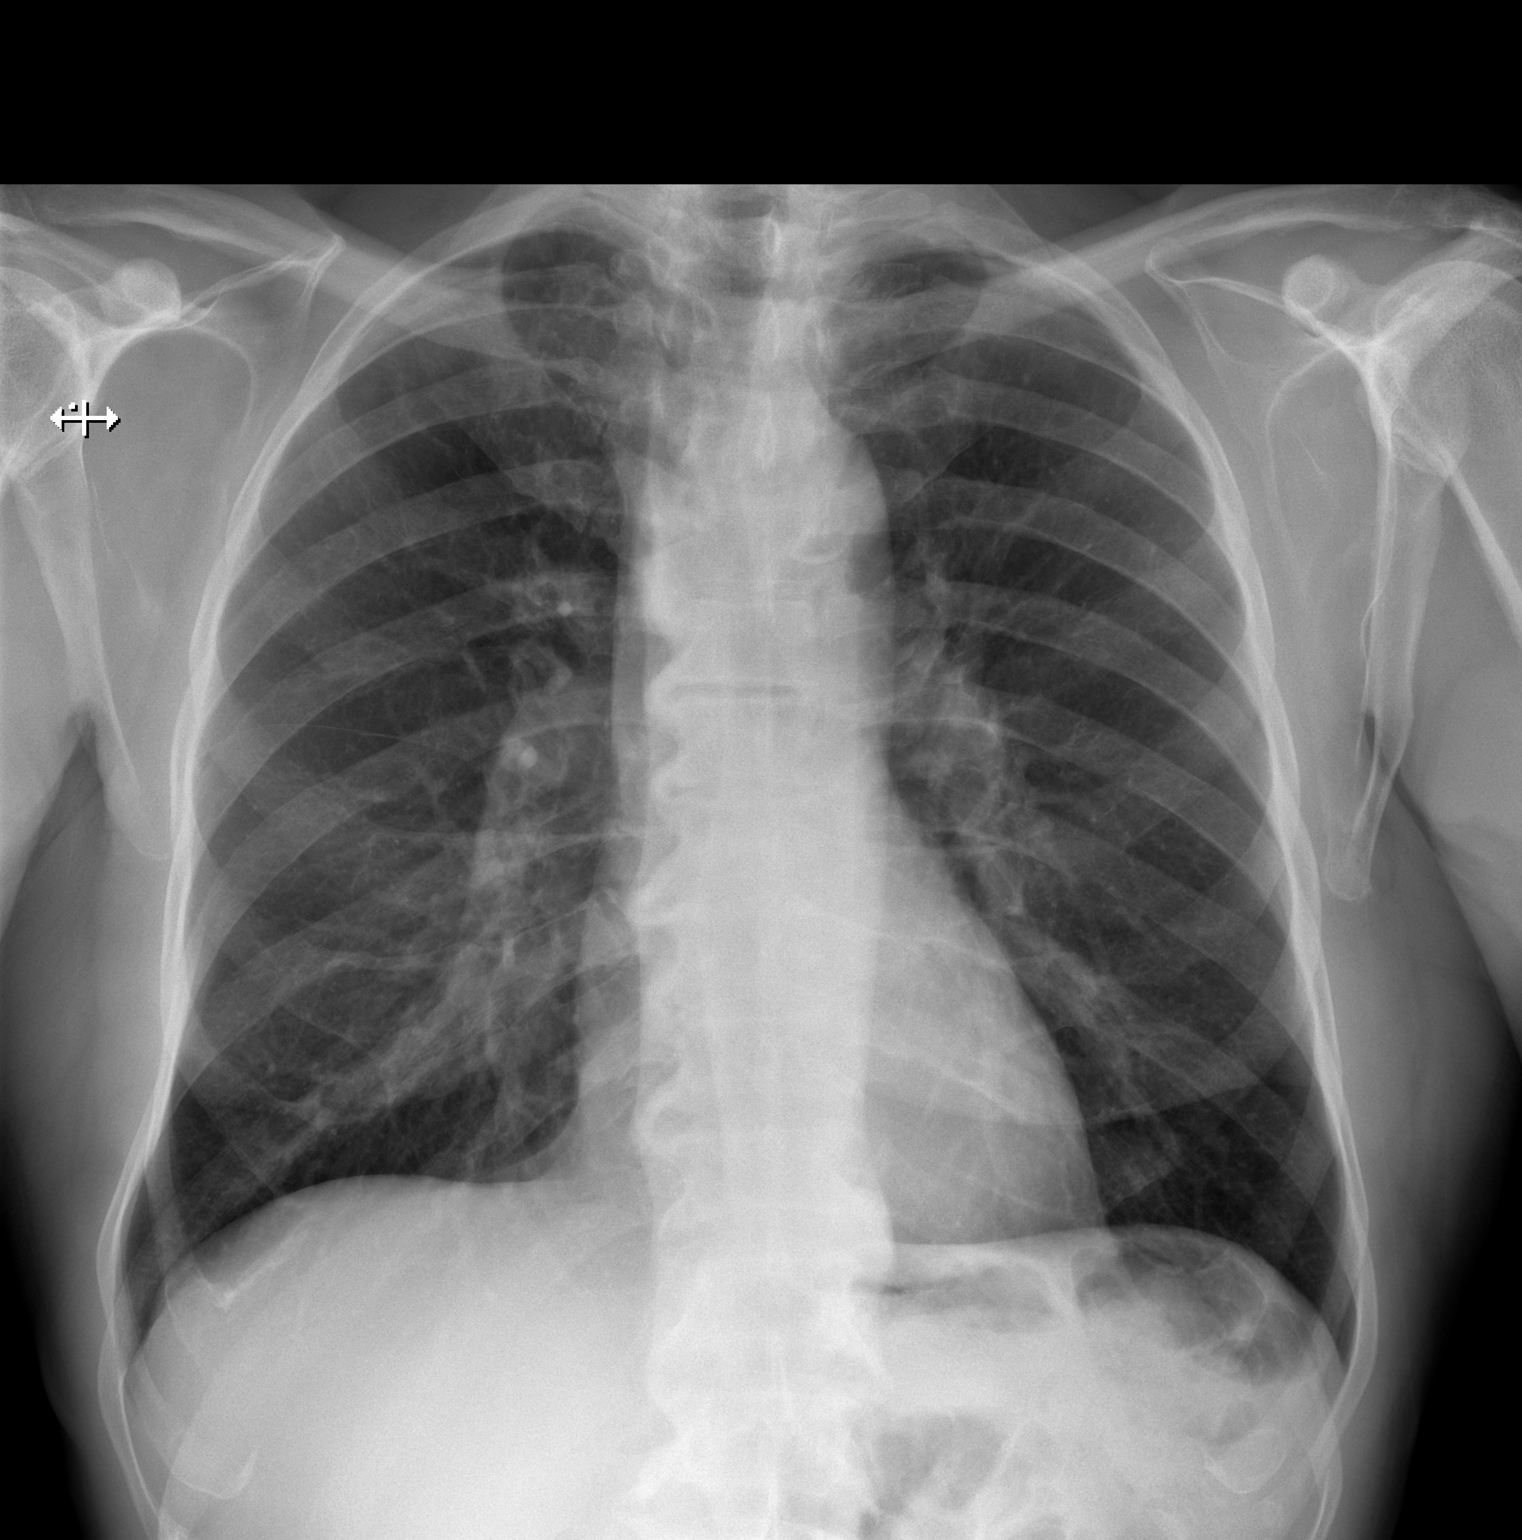

[w chest lat]
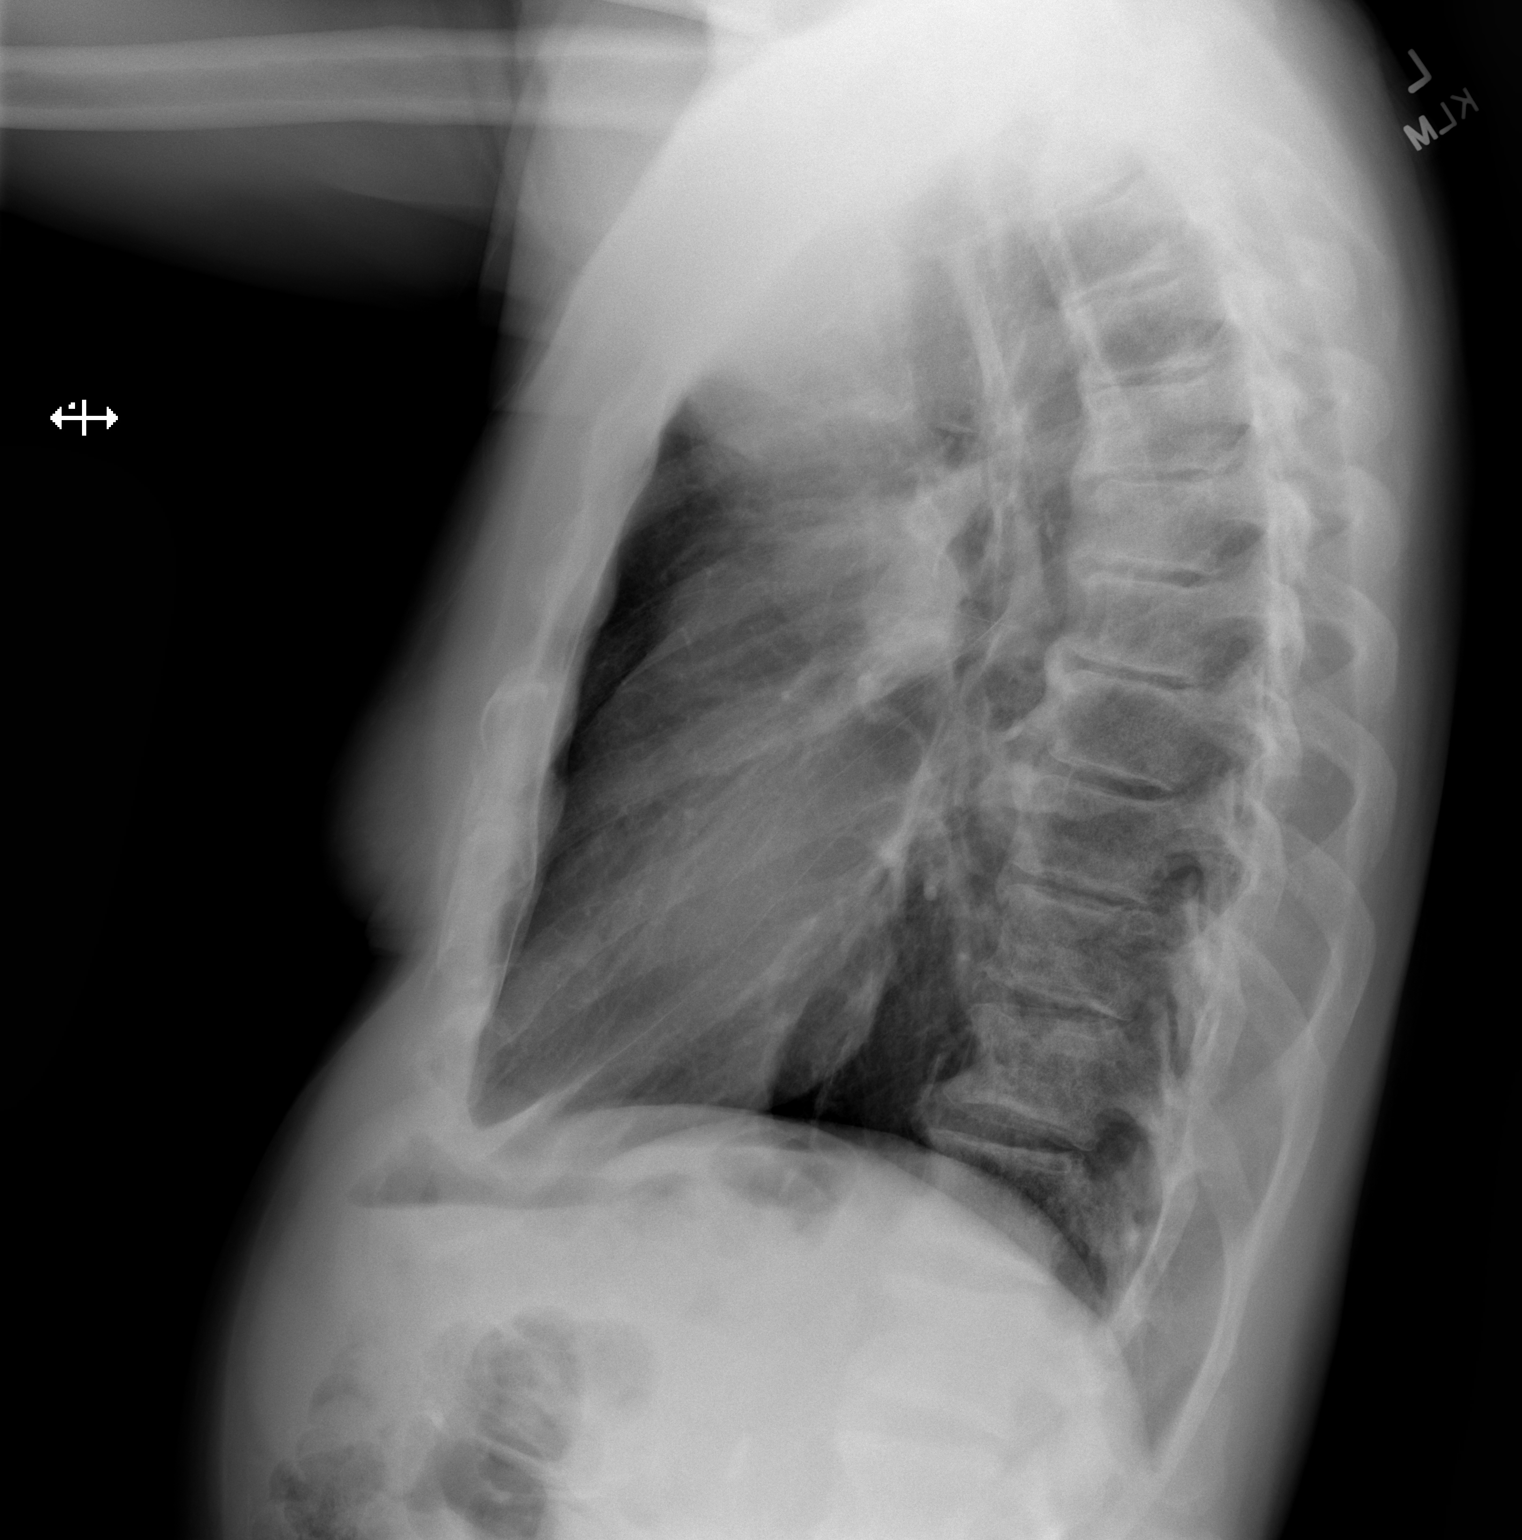

[2 of 2 positions shown; findings below may reference images not displayed]

FINDINGS: The heart size and mediastinal contours are within normal limits.
Both lungs are clear. The visualized skeletal structures are
unremarkable.
IMPRESSION: No active cardiopulmonary disease.

## 2023-05-17 DIAGNOSIS — M5414 Radiculopathy, thoracic region: Secondary | ICD-10-CM | POA: Diagnosis not present

## 2023-05-17 DIAGNOSIS — G8912 Acute post-thoracotomy pain: Secondary | ICD-10-CM | POA: Diagnosis not present

## 2023-05-19 ENCOUNTER — Other Ambulatory Visit (HOSPITAL_COMMUNITY): Payer: Self-pay

## 2023-05-19 DIAGNOSIS — E291 Testicular hypofunction: Secondary | ICD-10-CM | POA: Diagnosis not present

## 2023-05-19 DIAGNOSIS — E7800 Pure hypercholesterolemia, unspecified: Secondary | ICD-10-CM | POA: Diagnosis not present

## 2023-05-19 DIAGNOSIS — R7303 Prediabetes: Secondary | ICD-10-CM | POA: Diagnosis not present

## 2023-05-19 DIAGNOSIS — I10 Essential (primary) hypertension: Secondary | ICD-10-CM | POA: Diagnosis not present

## 2023-05-19 DIAGNOSIS — G894 Chronic pain syndrome: Secondary | ICD-10-CM | POA: Diagnosis not present

## 2023-05-19 MED ORDER — METOPROLOL TARTRATE 25 MG PO TABS
25.0000 mg | ORAL_TABLET | Freq: Two times a day (BID) | ORAL | 3 refills | Status: DC
Start: 2023-05-19 — End: 2023-08-17
  Filled 2023-05-19 – 2023-06-28 (×2): qty 60, 30d supply, fill #0

## 2023-05-19 MED ORDER — AMLODIPINE BESYLATE 5 MG PO TABS
5.0000 mg | ORAL_TABLET | Freq: Every day | ORAL | 5 refills | Status: DC
  Filled 2023-05-19 – 2023-06-13 (×2): qty 30, 30d supply, fill #0
  Filled 2023-07-10: qty 30, 30d supply, fill #1

## 2023-05-20 ENCOUNTER — Other Ambulatory Visit (HOSPITAL_COMMUNITY): Payer: Self-pay

## 2023-05-25 ENCOUNTER — Other Ambulatory Visit (HOSPITAL_BASED_OUTPATIENT_CLINIC_OR_DEPARTMENT_OTHER): Payer: Self-pay

## 2023-06-01 ENCOUNTER — Other Ambulatory Visit (HOSPITAL_BASED_OUTPATIENT_CLINIC_OR_DEPARTMENT_OTHER): Payer: Self-pay

## 2023-06-02 ENCOUNTER — Other Ambulatory Visit: Payer: Self-pay

## 2023-06-02 ENCOUNTER — Other Ambulatory Visit (HOSPITAL_BASED_OUTPATIENT_CLINIC_OR_DEPARTMENT_OTHER): Payer: Self-pay

## 2023-06-02 DIAGNOSIS — E291 Testicular hypofunction: Secondary | ICD-10-CM | POA: Diagnosis not present

## 2023-06-04 ENCOUNTER — Other Ambulatory Visit: Payer: Self-pay

## 2023-06-04 ENCOUNTER — Other Ambulatory Visit (HOSPITAL_BASED_OUTPATIENT_CLINIC_OR_DEPARTMENT_OTHER): Payer: Self-pay

## 2023-06-10 DIAGNOSIS — F32A Depression, unspecified: Secondary | ICD-10-CM | POA: Diagnosis not present

## 2023-06-10 DIAGNOSIS — Z87891 Personal history of nicotine dependence: Secondary | ICD-10-CM | POA: Diagnosis not present

## 2023-06-10 DIAGNOSIS — R519 Headache, unspecified: Secondary | ICD-10-CM | POA: Diagnosis not present

## 2023-06-10 DIAGNOSIS — Z85820 Personal history of malignant melanoma of skin: Secondary | ICD-10-CM | POA: Diagnosis not present

## 2023-06-10 DIAGNOSIS — Z888 Allergy status to other drugs, medicaments and biological substances status: Secondary | ICD-10-CM | POA: Diagnosis not present

## 2023-06-10 DIAGNOSIS — F419 Anxiety disorder, unspecified: Secondary | ICD-10-CM | POA: Diagnosis not present

## 2023-06-10 DIAGNOSIS — G4700 Insomnia, unspecified: Secondary | ICD-10-CM | POA: Diagnosis not present

## 2023-06-10 DIAGNOSIS — C4359 Malignant melanoma of other part of trunk: Secondary | ICD-10-CM | POA: Diagnosis not present

## 2023-06-10 DIAGNOSIS — M792 Neuralgia and neuritis, unspecified: Secondary | ICD-10-CM | POA: Diagnosis not present

## 2023-06-10 DIAGNOSIS — Z7982 Long term (current) use of aspirin: Secondary | ICD-10-CM | POA: Diagnosis not present

## 2023-06-10 DIAGNOSIS — Z79899 Other long term (current) drug therapy: Secondary | ICD-10-CM | POA: Diagnosis not present

## 2023-06-10 DIAGNOSIS — Z79891 Long term (current) use of opiate analgesic: Secondary | ICD-10-CM | POA: Diagnosis not present

## 2023-06-10 DIAGNOSIS — Z85118 Personal history of other malignant neoplasm of bronchus and lung: Secondary | ICD-10-CM | POA: Diagnosis not present

## 2023-06-10 DIAGNOSIS — Z08 Encounter for follow-up examination after completed treatment for malignant neoplasm: Secondary | ICD-10-CM | POA: Diagnosis not present

## 2023-06-16 DIAGNOSIS — E291 Testicular hypofunction: Secondary | ICD-10-CM | POA: Diagnosis not present

## 2023-06-23 DIAGNOSIS — G8912 Acute post-thoracotomy pain: Secondary | ICD-10-CM | POA: Diagnosis not present

## 2023-06-28 ENCOUNTER — Other Ambulatory Visit (HOSPITAL_COMMUNITY): Payer: Self-pay

## 2023-06-28 ENCOUNTER — Other Ambulatory Visit (HOSPITAL_BASED_OUTPATIENT_CLINIC_OR_DEPARTMENT_OTHER): Payer: Self-pay

## 2023-06-28 MED ORDER — METOPROLOL TARTRATE 50 MG PO TABS
50.0000 mg | ORAL_TABLET | Freq: Two times a day (BID) | ORAL | 5 refills | Status: DC
  Filled 2023-06-28 – 2023-06-30 (×2): qty 60, 30d supply, fill #0
  Filled 2023-07-10 – 2023-07-27 (×2): qty 60, 30d supply, fill #1

## 2023-06-29 ENCOUNTER — Other Ambulatory Visit (HOSPITAL_BASED_OUTPATIENT_CLINIC_OR_DEPARTMENT_OTHER): Payer: Self-pay

## 2023-06-30 ENCOUNTER — Other Ambulatory Visit (HOSPITAL_COMMUNITY): Payer: Self-pay

## 2023-06-30 ENCOUNTER — Other Ambulatory Visit (HOSPITAL_BASED_OUTPATIENT_CLINIC_OR_DEPARTMENT_OTHER): Payer: Self-pay

## 2023-06-30 DIAGNOSIS — E291 Testicular hypofunction: Secondary | ICD-10-CM | POA: Diagnosis not present

## 2023-06-30 MED ORDER — PREGABALIN 50 MG PO CAPS
50.0000 mg | ORAL_CAPSULE | Freq: Two times a day (BID) | ORAL | 0 refills | Status: DC
  Filled 2023-06-30: qty 60, 30d supply, fill #0

## 2023-07-01 ENCOUNTER — Other Ambulatory Visit (HOSPITAL_BASED_OUTPATIENT_CLINIC_OR_DEPARTMENT_OTHER): Payer: Self-pay

## 2023-07-05 ENCOUNTER — Other Ambulatory Visit (HOSPITAL_BASED_OUTPATIENT_CLINIC_OR_DEPARTMENT_OTHER): Payer: Self-pay

## 2023-07-10 ENCOUNTER — Other Ambulatory Visit (HOSPITAL_COMMUNITY): Payer: Self-pay

## 2023-07-10 ENCOUNTER — Other Ambulatory Visit (HOSPITAL_BASED_OUTPATIENT_CLINIC_OR_DEPARTMENT_OTHER): Payer: Self-pay

## 2023-07-12 ENCOUNTER — Other Ambulatory Visit (HOSPITAL_COMMUNITY): Payer: Self-pay

## 2023-07-12 ENCOUNTER — Other Ambulatory Visit: Payer: Self-pay

## 2023-07-12 MED ORDER — ALPRAZOLAM 0.5 MG PO TABS: 0.2500 mg | ORAL_TABLET | Freq: Two times a day (BID) | ORAL | 0 refills | Status: DC

## 2023-07-14 DIAGNOSIS — E291 Testicular hypofunction: Secondary | ICD-10-CM | POA: Diagnosis not present

## 2023-07-15 DIAGNOSIS — D485 Neoplasm of uncertain behavior of skin: Secondary | ICD-10-CM | POA: Diagnosis not present

## 2023-07-15 DIAGNOSIS — Z08 Encounter for follow-up examination after completed treatment for malignant neoplasm: Secondary | ICD-10-CM | POA: Diagnosis not present

## 2023-07-15 DIAGNOSIS — Z1283 Encounter for screening for malignant neoplasm of skin: Secondary | ICD-10-CM | POA: Diagnosis not present

## 2023-07-15 DIAGNOSIS — Z85820 Personal history of malignant melanoma of skin: Secondary | ICD-10-CM | POA: Diagnosis not present

## 2023-07-15 DIAGNOSIS — D225 Melanocytic nevi of trunk: Secondary | ICD-10-CM | POA: Diagnosis not present

## 2023-07-15 DIAGNOSIS — L570 Actinic keratosis: Secondary | ICD-10-CM | POA: Diagnosis not present

## 2023-07-15 DIAGNOSIS — X32XXXD Exposure to sunlight, subsequent encounter: Secondary | ICD-10-CM | POA: Diagnosis not present

## 2023-07-27 ENCOUNTER — Other Ambulatory Visit (HOSPITAL_BASED_OUTPATIENT_CLINIC_OR_DEPARTMENT_OTHER): Payer: Self-pay

## 2023-07-27 DIAGNOSIS — E291 Testicular hypofunction: Secondary | ICD-10-CM | POA: Diagnosis not present

## 2023-07-27 MED ORDER — AMOXICILLIN 500 MG PO CAPS
500.0000 mg | ORAL_CAPSULE | Freq: Four times a day (QID) | ORAL | 1 refills | Status: DC
  Filled 2023-07-27: qty 40, 10d supply, fill #0

## 2023-08-02 ENCOUNTER — Other Ambulatory Visit (HOSPITAL_BASED_OUTPATIENT_CLINIC_OR_DEPARTMENT_OTHER): Payer: Self-pay

## 2023-08-03 DIAGNOSIS — Z87891 Personal history of nicotine dependence: Secondary | ICD-10-CM | POA: Diagnosis not present

## 2023-08-03 DIAGNOSIS — C3412 Malignant neoplasm of upper lobe, left bronchus or lung: Secondary | ICD-10-CM | POA: Diagnosis not present

## 2023-08-03 DIAGNOSIS — M25511 Pain in right shoulder: Secondary | ICD-10-CM | POA: Diagnosis not present

## 2023-08-03 DIAGNOSIS — R918 Other nonspecific abnormal finding of lung field: Secondary | ICD-10-CM | POA: Diagnosis not present

## 2023-08-03 DIAGNOSIS — C4359 Malignant melanoma of other part of trunk: Secondary | ICD-10-CM | POA: Diagnosis not present

## 2023-08-03 DIAGNOSIS — R079 Chest pain, unspecified: Secondary | ICD-10-CM | POA: Diagnosis not present

## 2023-08-03 DIAGNOSIS — R93422 Abnormal radiologic findings on diagnostic imaging of left kidney: Secondary | ICD-10-CM | POA: Diagnosis not present

## 2023-08-03 DIAGNOSIS — R0602 Shortness of breath: Secondary | ICD-10-CM | POA: Diagnosis not present

## 2023-08-09 ENCOUNTER — Other Ambulatory Visit (HOSPITAL_BASED_OUTPATIENT_CLINIC_OR_DEPARTMENT_OTHER): Payer: Self-pay

## 2023-08-09 MED ORDER — PREGABALIN 50 MG PO CAPS
50.0000 mg | ORAL_CAPSULE | Freq: Two times a day (BID) | ORAL | 0 refills | Status: DC
  Filled 2023-08-09: qty 60, 30d supply, fill #0

## 2023-08-10 ENCOUNTER — Other Ambulatory Visit (HOSPITAL_COMMUNITY): Payer: Self-pay | Admitting: Cardiothoracic Surgery

## 2023-08-10 DIAGNOSIS — E291 Testicular hypofunction: Secondary | ICD-10-CM | POA: Diagnosis not present

## 2023-08-10 DIAGNOSIS — N2889 Other specified disorders of kidney and ureter: Secondary | ICD-10-CM

## 2023-08-12 ENCOUNTER — Other Ambulatory Visit (HOSPITAL_BASED_OUTPATIENT_CLINIC_OR_DEPARTMENT_OTHER): Payer: Self-pay

## 2023-08-12 ENCOUNTER — Telehealth: Payer: Self-pay | Admitting: Internal Medicine

## 2023-08-12 NOTE — Telephone Encounter (Signed)
 Stated the patient would like to start seeing Dr. Raford at [facility]. Please advise

## 2023-08-13 NOTE — Telephone Encounter (Signed)
 Patient scheduled to see Reche ORN NP 8/12

## 2023-08-16 ENCOUNTER — Encounter (HOSPITAL_BASED_OUTPATIENT_CLINIC_OR_DEPARTMENT_OTHER): Payer: Self-pay

## 2023-08-16 NOTE — Progress Notes (Signed)
 Cardiology Office Note   Date:  08/17/2023  ID:  Elaine, Middleton [birth year], MRN [id number] PCP: Arloa Elsie SAUNDERS, MD  [facility] Providers Cardiologist:  Alvan Ronal BRAVO, MD (Inactive)     History of Present Illness ANDRUE DINI is a 78 y.o. male with hx of prior tobacco use, HTN, PVC, melanoma and adenocarcinoma of left upper lobe s/p thorascopic L upper lobectomy 09/2022 complicated by post thoracotomy pain syndrome. Family history notable for coronary artery disease.   Seen by Dr. Ronal Alvan 12/18/20 with fatigue x 6 months. Echo 01/07/21 LVEF 55-60%, no RWMA, mild concentric LVH, gr1dd, RV normal, severe LAE, moderate RAE, trivial MR, aortic valve sclerosis without stenosis, mild dilation aortic root 37mm. No cardiac etiology of fatigue noted.  Admitted 09/2022 for L upper lobectomy. Brief episode of PAF which resolved spontaneously and self-limiting per notes. He was not started on OAC due to brief nature in post-procedural period.  Metoprolol  was increased prior to discharge for BP control and Amlodipine  initiated.  Presents today to establish care at [facility] location with Dr. Raford after Dr. Ranae departure.  Last September he was diagnosed with cancer as above and while hospitalized for left upper lobe lobectomy had brief episode of atrial fibrillation.  Has been on metoprolol  since that time.  No palpitations or A-fib recurrence.  He notes adverse effects of erectile dysfunction while taking metoprolol .  Reports BP not well-controlled despite multiple changes by primary care team.  He was on irbesartan  300 mg daily which he tolerated without issue but was transition to amlodipine  5 mg daily as felt to be ineffective.  He prefers to know the pharmacokinetics of medications he takes to know what they are doing and how they work.  We reviewed beta-blockers, calcium channel blockers, and ARB pharmacokinetics. Of note, has persistent post thoracotomy pain syndrome  despite 4 interventions with pain clinic and plastic surgery minimally responsive to lidocaine or steroids. Notes the pain affects his blood pressure which he is concerned by. Did not require antihypertensives prior to surgery. Does have upper arm BP cuff at home which has not been previously checked for accuracy. Reviewed CT chest 04/02/23 with coronary calcification on CT scan calcified atherosclerotic disease within the coronary arterial distribution. Interested in further evaluation.   ROS: Please see the history of present illness.    All other systems reviewed and are negative.   Studies Reviewed      Cardiac Studies & Procedures   ______________________________________________________________________________________________     ECHOCARDIOGRAM  ECHOCARDIOGRAM COMPLETE 01/07/2021  Narrative ECHOCARDIOGRAM REPORT    Patient Name:   JAVARION DOUTY Date of Exam: 01/07/2021 Medical Rec #:  [id number]       Height:       72.0 in Accession #:    [id number]      Weight:       176.8 lb Date of Birth:  1945/11/28       BSA:          2.022 m Patient Age:    75 years        BP:           150/82 mmHg Patient Gender: M               HR:           74 bpm. Exam Location:  [address]  Procedure: 2D Echo, Cardiac Doppler and Color Doppler  Indications:    R06.00 SOB  History:  Patient has no prior history of Echocardiogram examinations. Arrythmias:PVC, Signs/Symptoms:Shortness of Breath; Risk Factors:Dyslipidemia.  Sonographer:    Elsie Bohr RDCS Referring Phys: [phone] MARY E BRANCH  IMPRESSIONS   1. Left ventricular ejection fraction, by estimation, is 55 to 60%. The left ventricle has normal function. The left ventricle has no regional wall motion abnormalities. There is mild concentric left ventricular hypertrophy. Left ventricular diastolic parameters are consistent with Grade I diastolic dysfunction (impaired relaxation). 2. Right ventricular systolic function is  normal. The right ventricular size is normal. There is normal pulmonary artery systolic pressure. 3. Left atrial size was severely dilated. 4. Right atrial size was moderately dilated. 5. The mitral valve is normal in structure. Trivial mitral valve regurgitation. No evidence of mitral stenosis. 6. The aortic valve is tricuspid. There is mild calcification of the aortic valve. There is mild thickening of the aortic valve. Aortic valve regurgitation is trivial. Aortic valve sclerosis is present, with no evidence of aortic valve stenosis. 7. Aortic dilatation noted. There is mild dilatation of the aortic root, measuring 37 mm. 8. The inferior vena cava is normal in size with greater than 50% respiratory variability, suggesting right atrial pressure of 3 mmHg.  FINDINGS Left Ventricle: Left ventricular ejection fraction, by estimation, is 55 to 60%. The left ventricle has normal function. The left ventricle has no regional wall motion abnormalities. The left ventricular internal cavity size was normal in size. There is mild concentric left ventricular hypertrophy. Left ventricular diastolic parameters are consistent with Grade I diastolic dysfunction (impaired relaxation). Normal left ventricular filling pressure.  Right Ventricle: The right ventricular size is normal. No increase in right ventricular wall thickness. Right ventricular systolic function is normal. There is normal pulmonary artery systolic pressure. The tricuspid regurgitant velocity is 2.51 m/s, and with an assumed right atrial pressure of 3 mmHg, the estimated right ventricular systolic pressure is 28.2 mmHg.  Left Atrium: Left atrial size was severely dilated.  Right Atrium: Right atrial size was moderately dilated.  Pericardium: There is no evidence of pericardial effusion.  Mitral Valve: The mitral valve is normal in structure. Trivial mitral valve regurgitation. No evidence of mitral valve stenosis.  Tricuspid Valve: The  tricuspid valve is normal in structure. Tricuspid valve regurgitation is trivial. No evidence of tricuspid stenosis.  Aortic Valve: The aortic valve is tricuspid. There is mild calcification of the aortic valve. There is mild thickening of the aortic valve. Aortic valve regurgitation is trivial. Aortic regurgitation PHT measures 399 msec. Aortic valve sclerosis is present, with no evidence of aortic valve stenosis.  Pulmonic Valve: The pulmonic valve was normal in structure. Pulmonic valve regurgitation is trivial. No evidence of pulmonic stenosis.  Aorta: Aortic dilatation noted. There is mild dilatation of the aortic root, measuring 37 mm.  Venous: The inferior vena cava is normal in size with greater than 50% respiratory variability, suggesting right atrial pressure of 3 mmHg.  IAS/Shunts: No atrial level shunt detected by color flow Doppler.   LEFT VENTRICLE PLAX 2D LVIDd:         4.00 cm   Diastology LVIDs:         2.80 cm   LV e' medial:    9.25 cm/s LV PW:         1.10 cm   LV E/e' medial:  7.8 LV IVS:        1.10 cm   LV e' lateral:   13.90 cm/s LVOT diam:     2.50 cm  LV E/e' lateral: 5.2 LV SV:         127 LV SV Index:   63 LVOT Area:     4.91 cm   RIGHT VENTRICLE             IVC RV S prime:     21.80 cm/s  IVC diam: 1.70 cm TAPSE (M-mode): 2.5 cm RVSP:           28.2 mmHg  LEFT ATRIUM           Index        RIGHT ATRIUM           Index LA diam:      3.10 cm 1.53 cm/m   RA Pressure: 3.00 mmHg LA Vol (A2C): 83.0 ml 41.05 ml/m  RA Area:     20.00 cm LA Vol (A4C): 57.2 ml 28.29 ml/m  RA Volume:   69.10 ml  34.18 ml/m AORTIC VALVE LVOT Vmax:   129.00 cm/s LVOT Vmean:  83.600 cm/s LVOT VTI:    0.258 m AI PHT:      399 msec  AORTA Ao Root diam: 3.70 cm Ao Asc diam:  3.50 cm  MITRAL VALVE               TRICUSPID VALVE MV Area (PHT): 2.28 cm    TR Peak grad:   25.2 mmHg MV Decel Time: 333 msec    TR Vmax:        251.00 cm/s MV E velocity: 71.80 cm/s   Estimated RAP:  3.00 mmHg MV A velocity: 81.80 cm/s  RVSP:           28.2 mmHg MV E/A ratio:  0.88 SHUNTS Systemic VTI:  0.26 m Systemic Diam: 2.50 cm  Annabella Scarce MD Electronically signed by Annabella Scarce MD Signature Date/Time: 01/07/2021/6:48:20 PM    Final          ______________________________________________________________________________________________      Risk Assessment/Calculations  CHA2DS2-VASc Score = 4   This indicates a 4.8% annual risk of stroke. The patient's score is based upon: CHF History: 0 HTN History: 1 Diabetes History: 0 Stroke History: 0 Vascular Disease History: 1 Age Score: 2 Gender Score: 0           Physical Exam VS:  BP (!) 140/80   Pulse (!) 56   Ht 6' (1.829 m)   Wt 182 lb 8 oz (82.8 kg)   SpO2 99%   BMI 24.75 kg/m        Wt Readings from Last 3 Encounters:  08/17/23 182 lb 8 oz (82.8 kg)  04/09/23 176 lb (79.8 kg)  02/24/23 176 lb (79.8 kg)    GEN: Well nourished, well developed in no acute distress NECK: No JVD; No carotid bruits CARDIAC: RRR, no murmurs, rubs, gallops RESPIRATORY:  Clear to auscultation without rales, wheezing or rhonchi  ABDOMEN: Soft, non-tender, non-distended EXTREMITIES:  No edema; No deformity   ASSESSMENT AND PLAN  HTN - BP not at goal <130/80 at clinic or home. Reports adverse effects of ED on Metoprolol . Previously tolerated Irbesartan  without issue, was stopped as ineffective and transitioned to amlodipine . Discussed likely need for 2-3 agents for optimal BP control. Reduce Metoprolol  tartrate to 25mg  BID x 3 days then discontinue Stop Amlodipine  Start Amlodipine -valsartan  5-160mg  daily BMET in 1 week for monitoring MyChart message in 2 weeks to check in on BP control. Bring BP cuff to next OV to ensure accuracy Recommend aiming for 150 minutes of  moderate intensity activity per week and following a heart healthy diet.    Coronary calcification on CT/HLD - PET 06/10/23 'mild  coronary calcifications'.  Family history of heart disease in his father.  He has had postthoracotomy pain syndrome though chest pain is nonexertional.  Most recent LDL with primary care 89.  Discussed LDL goal less than 70.  Plan for coronary CTA for further quantification of coronary atherosclerosis.  Will decide upon Rosuvastatin dosing based on CCTA result.  He is aware of nitroglycerin  use during study.  Will utilize metoprolol  tartrate 50 mg 2 hours prior to CCTA.  Post operative atrial fib -insetting of lobectomy was self-limiting without recurrence.  No indication for anticoagulation.       Dispo: follow up in 2 months with Reche GORMAN Finder, NP and in 4 months with Dr. Raford to establish care   Signed, Reche GORMAN Finder, NP

## 2023-08-17 ENCOUNTER — Other Ambulatory Visit (HOSPITAL_BASED_OUTPATIENT_CLINIC_OR_DEPARTMENT_OTHER): Payer: Self-pay

## 2023-08-17 ENCOUNTER — Encounter (HOSPITAL_BASED_OUTPATIENT_CLINIC_OR_DEPARTMENT_OTHER): Payer: Self-pay

## 2023-08-17 ENCOUNTER — Encounter (HOSPITAL_BASED_OUTPATIENT_CLINIC_OR_DEPARTMENT_OTHER): Payer: Self-pay | Admitting: Family

## 2023-08-17 ENCOUNTER — Ambulatory Visit (INDEPENDENT_AMBULATORY_CARE_PROVIDER_SITE_OTHER): Admitting: Family

## 2023-08-17 VITALS — BP 140/80 | HR 56 | Ht 72.0 in | Wt 182.5 lb

## 2023-08-17 DIAGNOSIS — I10 Essential (primary) hypertension: Secondary | ICD-10-CM

## 2023-08-17 DIAGNOSIS — I2510 Atherosclerotic heart disease of native coronary artery without angina pectoris: Secondary | ICD-10-CM | POA: Diagnosis not present

## 2023-08-17 DIAGNOSIS — E785 Hyperlipidemia, unspecified: Secondary | ICD-10-CM | POA: Diagnosis not present

## 2023-08-17 MED ORDER — AMLODIPINE BESYLATE-VALSARTAN 5-160 MG PO TABS
1.0000 | ORAL_TABLET | Freq: Every day | ORAL | 2 refills | Status: DC
  Filled 2023-08-17: qty 30, 30d supply, fill #0
  Filled 2023-08-24: qty 30, 30d supply, fill #1

## 2023-08-17 NOTE — Patient Instructions (Addendum)
 Medication Instructions:  Taper Metoprolol  tartrate 25mg  to twice daily for the next 3 days.  STOP Amlodipine    BEGIN TAKING Amlodipine - Valsartan  5.160mg . Take ine tablet daily.   On the day of the CARDIAC CTA, take Metoprolol  Tartrate 50mg , 2 hours before the procedure.   *If you need a refill on your cardiac medications before your next appointment, please call your pharmacy*   Lab Work: Return in one week for BMET  If you have labs (blood work) drawn today and your tests are completely normal, you will receive your results only by: MyChart Message (if you have MyChart) OR A paper copy in the mail If you have any lab test that is abnormal or we need to change your treatment, we will call you to review the results.   Testing/Procedures:   Your cardiac CT will be scheduled at one of the below locations:   [facility] [address] [place], [place] [address] [phone]  OR   [facility] [address] [place], [place] [address] [phone]  OR   [facility] [address] [place], [place] [address] [phone]  OR   [facility] and V[facility] Tower [address]  [place], [place] [address]  OR   [facility] [address] [place], [place]  If scheduled at [facility], please arrive at the [facility] and Children's Entrance (Entrance C2) of [facility] 30 minutes prior to test start time. You can use the FREE valet parking offered at entrance C (encouraged to control the heart rate for the test)  Proceed to the [facility] Radiology Department (first floor) to check-in and test prep.  All radiology patients and guests should use entrance C2 at [facility], accessed from [facility], even though the hospital's physical address listed is [address].  If scheduled at the Heart and Vascular Tower at [address], please enter the parking lot  using the [address] entrance and use the FREE valet service at the patient drop-off area. Enter the building and check-in with registration on the main floor.  If scheduled at [facility], please arrive to the Heart and Vascular Center 15 mins early for check-in and test prep.  There is spacious parking and easy access to the radiology department from the [facility] entrance. Please enter here and check-in with the desk attendant.   If scheduled at [facility], please arrive 30 minutes early for check-in and test prep.  Please follow these instructions carefully (unless otherwise directed):  An IV will be required for this test and Nitroglycerin  will be given.  Hold all erectile dysfunction medications at least 3 days (72 hrs) prior to test. (Ie viagra, cialis, sildenafil, tadalafil, etc)   On the Night Before the Test: Be sure to Drink plenty of water. Do not consume any caffeinated/decaffeinated beverages or chocolate 12 hours prior to your test. Do not take any antihistamines 12 hours prior to your test. If the patient has contrast allergy: Patient will need a prescription for Prednisone and very clear instructions (as follows): Prednisone 50 mg - take 13 hours prior to test Take another Prednisone 50 mg 7 hours prior to test Take another Prednisone 50 mg 1 hour prior to test Take Benadryl 50 mg 1 hour prior to test Patient must complete all four doses of above prophylactic medications. Patient will need a ride after test due to Benadryl.  On the Day of the Test: Drink plenty of water until 1 hour prior to the test. Do not eat any food 1 hour prior to test. You may take your regular medications prior to the test.  Take metoprolol  (Lopressor ) two hours prior to test. If you take Furosemide/Hydrochlorothiazide/Spironolactone/Chlorthalidone, please HOLD on the morning of the test. Patients who wear a continuous glucose monitor MUST  remove the device prior to scanning. FEMALES- please wear underwire-free bra if available, avoid dresses & tight clothing        After the Test: Drink plenty of water. After receiving IV contrast, you may experience a mild flushed feeling. This is normal. On occasion, you may experience a mild rash up to 24 hours after the test. This is not dangerous. If this occurs, you can take Benadryl 25 mg, Zyrtec, Claritin, or Allegra and increase your fluid intake. (Patients taking Tikosyn should avoid Benadryl, and may take Zyrtec, Claritin, or Allegra) If you experience trouble breathing, this can be serious. If it is severe call 911 IMMEDIATELY. If it is mild, please call our office.  We will call to schedule your test 2-4 weeks out understanding that some insurance companies will need an authorization prior to the service being performed.   For more information and frequently asked questions, please visit our website : [email or web address]  For non-scheduling related questions, please contact the cardiac imaging nurse navigator should you have any questions/concerns: Cardiac Imaging Nurse Navigators Direct Office Dial: [phone]   For scheduling needs, including cancellations and rescheduling, please call [place], [phone].    Follow-Up: At [facility], you and your health needs are our priority.  As part of our continuing mission to provide you with exceptional heart care, we have created designated Provider Care Teams.  These Care Teams include your primary Cardiologist (physician) and Advanced Practice Providers (APPs -  Physician Assistants and Nurse Practitioners) who all work together to provide you with the care you need, when you need it.  We recommend signing up for the patient portal called MyChart.  Sign up information is provided on this After Visit Summary.  MyChart is used to connect with patients for Virtual Visits (Telemedicine).  Patients are able  to view lab/test results, encounter notes, upcoming appointments, etc.  Non-urgent messages can be sent to your provider as well.   To learn more about what you can do with MyChart, go to [email or web address].    Your next appointment:   2 month(s)  Provider:   Reche Finder, NP and 4 month follow up with Dr. Raford        Tips to Measure your Blood Pressure Correctly Here's what you can do to ensure a correct reading:  Don't drink a caffeinated beverage or smoke during the 30 minutes before the test.  Sit quietly for five minutes before the test begins.  During the measurement, sit in a chair with your feet on the floor and your arm supported so your elbow is at about heart level.  The inflatable part of the cuff should completely cover at least 80% of your upper arm, and the cuff should be placed on bare skin, not over a shirt.  Don't talk during the measurement.  Have your blood pressure measured twice, with a brief break in between. If the readings are different by 5 points or more, have it done a third time.  In 2017, new guidelines from the American Heart Association, the [employer] of Cardiology, and nine other health organizations lowered  the diagnosis of high blood pressure to 130/80 mm Hg or higher for all adults. The guidelines also redefined the various blood pressure categories to now include normal, elevated, Stage 1 hypertension, Stage 2 hypertension, and hypertensive crisis (see Blood pressure categories).  Blood pressure categories  Blood pressure category SYSTOLIC (upper number)  DIASTOLIC (lower number)  Normal Less than 120 mm Hg and Less than 80 mm Hg  Elevated 120-129 mm Hg and Less than 80 mm Hg  High blood pressure: Stage 1 hypertension 130-139 mm Hg or 80-89 mm Hg  High blood pressure: Stage 2 hypertension 140 mm Hg or higher or 90 mm Hg or higher  Hypertensive crisis (consult your doctor immediately) Higher than 180 mm Hg and/or Higher than 120  mm Hg  Source: American Heart Association and American Stroke Association. For more on getting your blood pressure under control, buy Controlling Your Blood Pressure, a Special Health Report from [facility].   Blood Pressure Log   Date   Time  Blood Pressure  Position  Example: Nov 1 9 AM 124/78 sitting

## 2023-08-18 ENCOUNTER — Ambulatory Visit (HOSPITAL_COMMUNITY)
Admission: RE | Admit: 2023-08-18 | Discharge: 2023-08-18 | Disposition: A | Discharge status: Home / Self Care | Source: Ambulatory Visit | Attending: Cardiothoracic Surgery | Admitting: Cardiothoracic Surgery

## 2023-08-18 DIAGNOSIS — N2889 Other specified disorders of kidney and ureter: Secondary | ICD-10-CM | POA: Insufficient documentation

## 2023-08-18 DIAGNOSIS — C439 Malignant melanoma of skin, unspecified: Secondary | ICD-10-CM | POA: Diagnosis not present

## 2023-08-18 MED ORDER — IOHEXOL 300 MG/ML  SOLN
100.0000 mL | Freq: Once | INTRAMUSCULAR | Status: AC | PRN
  Administered 2023-08-18 (×2): 100 mL via INTRAVENOUS

## 2023-08-23 ENCOUNTER — Other Ambulatory Visit (HOSPITAL_BASED_OUTPATIENT_CLINIC_OR_DEPARTMENT_OTHER): Payer: Self-pay

## 2023-08-24 ENCOUNTER — Other Ambulatory Visit (HOSPITAL_BASED_OUTPATIENT_CLINIC_OR_DEPARTMENT_OTHER): Payer: Self-pay

## 2023-08-24 DIAGNOSIS — R7303 Prediabetes: Secondary | ICD-10-CM | POA: Diagnosis not present

## 2023-08-24 DIAGNOSIS — Z0000 Encounter for general adult medical examination without abnormal findings: Secondary | ICD-10-CM | POA: Diagnosis not present

## 2023-08-24 DIAGNOSIS — F419 Anxiety disorder, unspecified: Secondary | ICD-10-CM | POA: Diagnosis not present

## 2023-08-24 DIAGNOSIS — E7800 Pure hypercholesterolemia, unspecified: Secondary | ICD-10-CM | POA: Diagnosis not present

## 2023-08-24 DIAGNOSIS — E291 Testicular hypofunction: Secondary | ICD-10-CM | POA: Diagnosis not present

## 2023-08-24 DIAGNOSIS — I10 Essential (primary) hypertension: Secondary | ICD-10-CM | POA: Diagnosis not present

## 2023-08-24 DIAGNOSIS — G4700 Insomnia, unspecified: Secondary | ICD-10-CM | POA: Diagnosis not present

## 2023-08-24 DIAGNOSIS — G894 Chronic pain syndrome: Secondary | ICD-10-CM | POA: Diagnosis not present

## 2023-08-24 MED ORDER — TRAMADOL HCL 50 MG PO TABS
50.0000 mg | ORAL_TABLET | Freq: Two times a day (BID) | ORAL | 5 refills | Status: DC
  Filled 2023-08-24 – 2023-08-26 (×5): qty 60, 30d supply, fill #0
  Filled 2023-09-27: qty 60, 30d supply, fill #1

## 2023-08-24 MED ORDER — ALPRAZOLAM 0.5 MG PO TABS
0.5000 mg | ORAL_TABLET | Freq: Every day | ORAL | 1 refills | Status: DC
  Filled 2023-08-24: qty 90, 90d supply, fill #0
  Filled 2023-09-27: qty 90, 90d supply, fill #1

## 2023-08-25 ENCOUNTER — Telehealth (HOSPITAL_COMMUNITY): Payer: Self-pay | Admitting: Emergency Medicine

## 2023-08-25 ENCOUNTER — Other Ambulatory Visit (HOSPITAL_BASED_OUTPATIENT_CLINIC_OR_DEPARTMENT_OTHER): Payer: Self-pay

## 2023-08-25 MED ORDER — ZOLPIDEM TARTRATE 10 MG PO TABS
10.0000 mg | ORAL_TABLET | Freq: Every evening | ORAL | 1 refills | Status: AC | PRN
  Filled 2023-09-27 – 2023-11-26 (×2): qty 90, 90d supply, fill #0

## 2023-08-25 NOTE — Telephone Encounter (Signed)
 Reaching out to patient to offer assistance regarding upcoming cardiac imaging study; pt verbalizes understanding of appt date/time, parking situation and where to check in, pre-test NPO status and medications ordered, and verified current allergies; name and call back number provided for further questions should they arise Rockwell Alexandria RN Navigator Cardiac Imaging [facility] [phone] office [phone] cell

## 2023-08-26 ENCOUNTER — Other Ambulatory Visit (HOSPITAL_BASED_OUTPATIENT_CLINIC_OR_DEPARTMENT_OTHER): Payer: Self-pay

## 2023-08-27 ENCOUNTER — Encounter (HOSPITAL_COMMUNITY): Payer: Self-pay

## 2023-08-27 DIAGNOSIS — N281 Cyst of kidney, acquired: Secondary | ICD-10-CM | POA: Diagnosis not present

## 2023-08-27 DIAGNOSIS — N2889 Other specified disorders of kidney and ureter: Secondary | ICD-10-CM | POA: Diagnosis not present

## 2023-08-31 ENCOUNTER — Ambulatory Visit (HOSPITAL_BASED_OUTPATIENT_CLINIC_OR_DEPARTMENT_OTHER): Payer: Self-pay | Admitting: Family

## 2023-08-31 ENCOUNTER — Other Ambulatory Visit: Payer: Self-pay | Admitting: Cardiology

## 2023-08-31 ENCOUNTER — Other Ambulatory Visit (HOSPITAL_COMMUNITY): Payer: Self-pay | Admitting: Emergency Medicine

## 2023-08-31 ENCOUNTER — Ambulatory Visit (HOSPITAL_COMMUNITY)
Admission: RE | Admit: 2023-08-31 | Discharge: 2023-08-31 | Disposition: A | Discharge status: Home / Self Care | Source: Ambulatory Visit | Attending: Family | Admitting: Family

## 2023-08-31 DIAGNOSIS — I2510 Atherosclerotic heart disease of native coronary artery without angina pectoris: Secondary | ICD-10-CM

## 2023-08-31 DIAGNOSIS — R079 Chest pain, unspecified: Secondary | ICD-10-CM | POA: Insufficient documentation

## 2023-08-31 DIAGNOSIS — I700 Atherosclerosis of aorta: Secondary | ICD-10-CM | POA: Diagnosis not present

## 2023-08-31 DIAGNOSIS — Z5181 Encounter for therapeutic drug level monitoring: Secondary | ICD-10-CM

## 2023-08-31 DIAGNOSIS — E785 Hyperlipidemia, unspecified: Secondary | ICD-10-CM | POA: Insufficient documentation

## 2023-08-31 DIAGNOSIS — I10 Essential (primary) hypertension: Secondary | ICD-10-CM

## 2023-08-31 MED ORDER — IOHEXOL 350 MG/ML SOLN
100.0000 mL | Freq: Once | INTRAVENOUS | Status: AC | PRN
  Administered 2023-08-31: 100 mL via INTRAVENOUS

## 2023-08-31 MED ORDER — NITROGLYCERIN 0.4 MG SL SUBL
0.8000 mg | SUBLINGUAL_TABLET | Freq: Once | SUBLINGUAL | Status: AC
  Administered 2023-08-31: 0.8 mg via SUBLINGUAL

## 2023-09-01 ENCOUNTER — Other Ambulatory Visit (HOSPITAL_BASED_OUTPATIENT_CLINIC_OR_DEPARTMENT_OTHER): Payer: Self-pay

## 2023-09-01 MED ORDER — APIXABAN 5 MG PO TABS
5.0000 mg | ORAL_TABLET | Freq: Two times a day (BID) | ORAL | 5 refills | Status: DC
  Filled 2023-09-01: qty 60, 30d supply, fill #0
  Filled 2023-09-27: qty 60, 30d supply, fill #1
  Filled 2023-11-09: qty 60, 30d supply, fill #2

## 2023-09-01 NOTE — Telephone Encounter (Signed)
 Patient is returning call. He is interested in starting on Eliquis  5 MG. As far as Rosuvastatin, he would like to discuss other options before going on a statin.

## 2023-09-02 ENCOUNTER — Other Ambulatory Visit (HOSPITAL_BASED_OUTPATIENT_CLINIC_OR_DEPARTMENT_OTHER): Payer: Self-pay

## 2023-09-02 MED ORDER — EZETIMIBE 10 MG PO TABS
10.0000 mg | ORAL_TABLET | Freq: Every day | ORAL | 3 refills | Status: AC
  Filled 2023-09-02: qty 90, 90d supply, fill #0
  Filled 2023-09-27 – 2023-12-20 (×2): qty 90, 90d supply, fill #1

## 2023-09-02 NOTE — Telephone Encounter (Signed)
 Spoke with pt, he would prefer to try the zetia . New script sent to the pharmacy and aware can stop aspirin.

## 2023-09-02 NOTE — Telephone Encounter (Signed)
 Patient is returning call.

## 2023-09-07 DIAGNOSIS — E291 Testicular hypofunction: Secondary | ICD-10-CM | POA: Diagnosis not present

## 2023-09-07 DIAGNOSIS — I10 Essential (primary) hypertension: Secondary | ICD-10-CM | POA: Diagnosis not present

## 2023-09-07 DIAGNOSIS — L729 Follicular cyst of the skin and subcutaneous tissue, unspecified: Secondary | ICD-10-CM | POA: Diagnosis not present

## 2023-09-10 ENCOUNTER — Encounter: Payer: Self-pay | Admitting: Physical Medicine & Rehabilitation

## 2023-09-10 ENCOUNTER — Telehealth: Payer: Self-pay | Admitting: Cardiovascular Disease

## 2023-09-10 NOTE — Telephone Encounter (Signed)
 Patient's wife call back but this was documented under her chart.  Attempted to call again, left message to call back.

## 2023-09-10 NOTE — Telephone Encounter (Signed)
 Pt c/o medication issue:  1. Name of Medication: amLODipine -valsartan  (EXFORGE ) 5-160 MG tablet   2. How are you currently taking this medication (dosage and times per day)?   Take 1 tablet by mouth daily.    3. Are you having a reaction (difficulty breathing--STAT)? No  4. What is your medication issue? Wife states that medication does not seem to be helping with pt's BP. Pt's BP continues to fluctuate  Pt c/o BP issue: STAT if pt c/o blurred vision, one-sided weakness or slurred speech.  STAT if BP is GREATER than 180/120 TODAY.  STAT if BP is LESS than 90/60 and SYMPTOMATIC TODAY  1. What is your BP concern? BP elevated  2. Have you taken any BP medication today? Yes  3. What are your last 5 BP readings? 9/4 - 162/78          145/68 - last night 9/5 - 144/69  4. Are you having any other symptoms (ex. Dizziness, headache, blurred vision, passed out)? Spacey

## 2023-09-10 NOTE — Telephone Encounter (Signed)
 Left message for patient's wife to call back.

## 2023-09-13 ENCOUNTER — Other Ambulatory Visit (HOSPITAL_BASED_OUTPATIENT_CLINIC_OR_DEPARTMENT_OTHER): Payer: Self-pay

## 2023-09-13 MED ORDER — AMLODIPINE BESYLATE-VALSARTAN 10-320 MG PO TABS
1.0000 | ORAL_TABLET | Freq: Every day | ORAL | 3 refills | Status: AC
  Filled 2023-09-13: qty 90, 90d supply, fill #0
  Filled 2023-09-27 – 2023-12-23 (×2): qty 90, 90d supply, fill #1

## 2023-09-13 NOTE — Telephone Encounter (Signed)
 Labs from 08/27/23 with PCP reviewed: stable kidney function (08/27/23 creatinine 1.24, GFR 60, K 5.2)   Recommend adjust to Amlodipine -Valsartan  10-320mg  daily. May take two of his 5-160mg  tablets together to use them up if he wishes. Repeat labs in 7-10 days to include BMET, Lp(a) as previously ordered.    Samuel Turner Finder, NP       Advised wife, verbalized  understanding  New Rx sent to pharmacy and message sent via mychart as well.

## 2023-09-13 NOTE — Telephone Encounter (Signed)
 Patient is returning call.

## 2023-09-13 NOTE — Telephone Encounter (Signed)
 Labs from 08/27/23 with PCP reviewed: stable kidney function (08/27/23 creatinine 1.24, GFR 60, K 5.2)  Recommend adjust to Amlodipine -Valsartan  10-320mg  daily. May take two of his 5-160mg  tablets together to use them up if he wishes. Repeat labs in 7-10 days to include BMET, Lp(a) as previously ordered.   Raseel Jans S Corrin Hingle, NP

## 2023-09-14 ENCOUNTER — Other Ambulatory Visit (HOSPITAL_BASED_OUTPATIENT_CLINIC_OR_DEPARTMENT_OTHER): Payer: Self-pay

## 2023-09-21 ENCOUNTER — Other Ambulatory Visit (HOSPITAL_BASED_OUTPATIENT_CLINIC_OR_DEPARTMENT_OTHER): Payer: Self-pay

## 2023-09-21 DIAGNOSIS — I10 Essential (primary) hypertension: Secondary | ICD-10-CM | POA: Diagnosis not present

## 2023-09-21 DIAGNOSIS — E785 Hyperlipidemia, unspecified: Secondary | ICD-10-CM | POA: Diagnosis not present

## 2023-09-21 DIAGNOSIS — I2510 Atherosclerotic heart disease of native coronary artery without angina pectoris: Secondary | ICD-10-CM | POA: Diagnosis not present

## 2023-09-21 LAB — BASIC METABOLIC PANEL WITH GFR
BUN/Creatinine Ratio: 14 (ref 10–24)
BUN: 17 mg/dL (ref 8–27)
CO2: 24 mmol/L (ref 20–29)
Calcium: 9.4 mg/dL (ref 8.6–10.2)
Chloride: 99 mmol/L (ref 96–106)
Creatinine, Ser: 1.24 mg/dL (ref 0.76–1.27)
Glucose: 115 mg/dL — ABNORMAL HIGH (ref 70–99)
Potassium: 4 mmol/L (ref 3.5–5.2)
Sodium: 138 mmol/L (ref 134–144)
eGFR: 60 mL/min/1.73 (ref >59)

## 2023-09-21 MED ORDER — OXYCODONE HCL 5 MG PO TABS
5.0000 mg | ORAL_TABLET | Freq: Three times a day (TID) | ORAL | 0 refills | Status: DC | PRN
  Filled 2023-09-21: qty 20, 7d supply, fill #0

## 2023-09-21 MED ORDER — NALOXONE HCL 4 MG/0.1ML NA LIQD
NASAL | 1 refills | Status: AC
  Filled 2023-09-21: qty 2, 1d supply, fill #0

## 2023-09-21 NOTE — Addendum Note (Signed)
 Addended by: FREDIRICK BEAU B on: 09/21/2023 12:02 PM   Modules accepted: Orders

## 2023-09-22 LAB — LIPOPROTEIN A (LPA): Lipoprotein (a): 36.7 nmol/L (ref <75.0)

## 2023-09-27 ENCOUNTER — Other Ambulatory Visit (HOSPITAL_BASED_OUTPATIENT_CLINIC_OR_DEPARTMENT_OTHER): Payer: Self-pay

## 2023-09-27 ENCOUNTER — Other Ambulatory Visit: Payer: Self-pay

## 2023-09-29 ENCOUNTER — Encounter (HOSPITAL_BASED_OUTPATIENT_CLINIC_OR_DEPARTMENT_OTHER): Payer: Self-pay | Admitting: Cardiovascular Disease

## 2023-10-01 ENCOUNTER — Other Ambulatory Visit (HOSPITAL_BASED_OUTPATIENT_CLINIC_OR_DEPARTMENT_OTHER): Payer: Self-pay

## 2023-10-01 MED ORDER — PREGABALIN 75 MG PO CAPS
75.0000 mg | ORAL_CAPSULE | Freq: Two times a day (BID) | ORAL | 5 refills | Status: DC
  Filled 2023-10-01: qty 60, 30d supply, fill #0

## 2023-10-04 ENCOUNTER — Other Ambulatory Visit (HOSPITAL_BASED_OUTPATIENT_CLINIC_OR_DEPARTMENT_OTHER): Payer: Self-pay

## 2023-10-05 ENCOUNTER — Other Ambulatory Visit (HOSPITAL_BASED_OUTPATIENT_CLINIC_OR_DEPARTMENT_OTHER): Payer: Self-pay

## 2023-10-05 ENCOUNTER — Other Ambulatory Visit: Payer: Self-pay

## 2023-10-05 DIAGNOSIS — E7800 Pure hypercholesterolemia, unspecified: Secondary | ICD-10-CM | POA: Diagnosis not present

## 2023-10-05 DIAGNOSIS — I10 Essential (primary) hypertension: Secondary | ICD-10-CM | POA: Diagnosis not present

## 2023-10-05 DIAGNOSIS — G4700 Insomnia, unspecified: Secondary | ICD-10-CM | POA: Diagnosis not present

## 2023-10-05 DIAGNOSIS — E291 Testicular hypofunction: Secondary | ICD-10-CM | POA: Diagnosis not present

## 2023-10-05 DIAGNOSIS — R7303 Prediabetes: Secondary | ICD-10-CM | POA: Diagnosis not present

## 2023-10-05 DIAGNOSIS — F419 Anxiety disorder, unspecified: Secondary | ICD-10-CM | POA: Diagnosis not present

## 2023-10-05 DIAGNOSIS — G894 Chronic pain syndrome: Secondary | ICD-10-CM | POA: Diagnosis not present

## 2023-10-05 MED ORDER — TIZANIDINE HCL 2 MG PO TABS
2.0000 mg | ORAL_TABLET | Freq: Every evening | ORAL | 3 refills | Status: AC | PRN
  Filled 2023-10-05 (×2): qty 30, 30d supply, fill #0

## 2023-10-18 DIAGNOSIS — R0782 Intercostal pain: Secondary | ICD-10-CM | POA: Diagnosis not present

## 2023-10-19 ENCOUNTER — Encounter: Payer: Self-pay | Admitting: Physical Medicine & Rehabilitation

## 2023-10-19 ENCOUNTER — Encounter: Attending: Physical Medicine & Rehabilitation | Admitting: Physical Medicine & Rehabilitation

## 2023-10-19 ENCOUNTER — Other Ambulatory Visit (HOSPITAL_BASED_OUTPATIENT_CLINIC_OR_DEPARTMENT_OTHER): Payer: Self-pay

## 2023-10-19 VITALS — BP 124/66 | HR 70 | Wt 178.0 lb

## 2023-10-19 DIAGNOSIS — G8928 Other chronic postprocedural pain: Secondary | ICD-10-CM | POA: Insufficient documentation

## 2023-10-19 DIAGNOSIS — G588 Other specified mononeuropathies: Secondary | ICD-10-CM | POA: Diagnosis not present

## 2023-10-19 MED ORDER — PREGABALIN 150 MG PO CAPS
150.0000 mg | ORAL_CAPSULE | Freq: Two times a day (BID) | ORAL | 1 refills | Status: AC
  Filled 2023-10-19: qty 60, 30d supply, fill #0
  Filled 2024-01-26 (×2): qty 60, 30d supply, fill #1

## 2023-10-19 NOTE — Patient Instructions (Addendum)
 Tapentadol Extended-Release Tablets What is this medication? TAPENTADOL (ta PEN ta dol) treats severe, chronic pain. It is prescribed when other pain medications have not worked or cannot be tolerated. It may also be used to treat nerve pain. It works by blocking pain signals in the brain. It belongs to a group of medications called opioids. This medication is long-acting. Do not use it to treat sudden pain. This medicine may be used for other purposes; ask your health care provider or pharmacist if you have questions. COMMON BRAND NAME(S): Nucynta ER What should I tell my care team before I take this medication? They need to know if you have any of these conditions: Brain tumor Frequently drink alcohol Head injury Heart disease Low adrenal gland function Lung disease Seizures Stomach or intestine problems Substance use disorder Taken an MAOI, such as Marplan, Nardil, or Parnate in the last 14 days Thyroid  disease An unusual or allergic reaction to tapentadol, other medications, foods, dyes, or preservatives Pregnant or trying to get pregnant Breastfeeding How should I use this medication? Take this medication by mouth with a glass of water. Do not cut, crush, or chew this medication. Do not take a tablet that is not whole. A broken or crushed tablet can be very dangerous. You may get too much medication. Swallow only one tablet at a time. Do not wet, soak, or lick the tablet before you take it. You can take it with or without food. If it upsets your stomach, take it with food. Follow the directions on the prescription label. Take your medication at regular intervals. Do not take it more often than directed. Do not stop taking except on your care team's advice. A special MedGuide will be given to you by the pharmacist with each prescription and refill. Be sure to read this information carefully each time. Talk to your care team about the use of this medication in children. Special care may be  needed. Overdosage: If you think you have taken too much of this medicine contact a poison control center or emergency room at once. NOTE: This medicine is only for you. Do not share this medicine with others. What if I miss a dose? If you miss a dose, take it as soon as you can. If it is almost time for your next dose, take only that dose. Do not take double or extra doses. What may interact with this medication? Do not take this medication with any of the following: Linezolid MAOIs, such as Marplan, Nardil, and Parnate Methylene blue Ozanimod Safinamide Samidorphan Tedizolid This medication may also interact with the following: Alcohol Antihistamines for allergy, cough, and cold Atropine Benzodiazepines, such as alprazolam , diazepam, lorazepam Certain medications for bladder problems, such as oxybutynin or tolterodine Certain medications for depression, such as amitriptyline , bupropion, escitalopram, fluoxetine, sertraline, trazodone, venlafaxine Certain medications for migraine headache, such as almotriptan, sumatriptan, zolmitriptan Certain medications for Parkinson disease, such as benztropine or trihexyphenidyl Certain medications for seizures, such as phenobarbital or primidone Certain medications for stomach problems, such as dicyclomine or hyoscyamine Certain medications for travel sickness, such as scopolamine Diuretics Ipratropium Medications that cause drowsiness before a procedure, such as propofol  Medications that help you fall asleep Medications that relax muscles Opioids for pain or cough Phenothiazines, such as chlorpromazine, prochlorperazine, thioridazine This list may not describe all possible interactions. Give your health care provider a list of all the medicines, herbs, non-prescription drugs, or dietary supplements you use. Also tell them if you smoke, drink alcohol, or use  illegal drugs. Some items may interact with your medicine. What should I watch for  while using this medication? Tell your care team if your pain does not go away, if it gets worse, or if you have new or a different type of pain. You may develop tolerance to this medication. Tolerance means that you will need a higher dose of the medication for pain relief. Tolerance is normal and is expected if you take this medication for a long time. Taking this medication with other substances that cause drowsiness, such as alcohol, benzodiazepines, or other opioids can cause serious side effects. Give your care team a list of all medications you use. They will tell you how much medication to take. Do not take more medication than directed. Call emergency services if you have problems breathing or staying awake. Long term use of this medication may cause your brain and body to depend on it. This can happen even when used as directed by your care team. You and your care team will work together to determine how long you will need to take this medication. If your care team wants you to stop this medication, the dose will be slowly lowered over time to reduce the risk of side effects. Naloxone  is an emergency medication used for an opioid overdose. An overdose can happen if you take too much of an opioid. It can also happen if an opioid is taken with some other medications or substances such as alcohol. Know the symptoms of an overdose, such as trouble breathing, unusually tired or sleepy, or not being able to respond or wake up. Make sure to tell caregivers and close contacts where your naloxone  is stored. Make sure they know how to use it. After naloxone  is given, the person giving it must call emergency services. Naloxone  is a temporary treatment. Repeat doses may be needed. This medication may affect your coordination, reaction time, or judgment. Do not drive or operate machinery until you know how this medication affects you. Sit up or stand slowly to reduce the risk of dizzy or fainting spells. Drinking  alcohol with this medication can increase the risk of these side effects. This medication will cause constipation. If you do not have a bowel movement for 3 days, call your care team. Your mouth may get dry. Chewing sugarless gum or sucking hard candy and drinking plenty of water may help. Contact your care team if the problem does not go away or is severe. Talk to your care team if you may be pregnant. Prolonged use of this medication during pregnancy can cause temporary withdrawal in a newborn. Talk to your care team before breastfeeding. Changes to your treatment plan may be needed. If you breastfeed while taking this medication, seek medical care right away if you notice the child has slow or noisy breathing, is unusually sleepy or not able to wake up, or is limp. Long-term use of this medication may cause infertility. Talk to your care team if you are concerned about your fertility. What side effects may I notice from receiving this medication? Side effects that you should report to your care team as soon as possible: Allergic reactions--skin rash, itching, hives, swelling of the face, lips, tongue, or throat CNS depression--slow or shallow breathing, shortness of breath, feeling faint, dizziness, confusion, trouble staying awake Low adrenal gland function--nausea, vomiting, loss of appetite, unusual weakness or fatigue, dizziness Low blood pressure--dizziness, feeling faint or lightheaded, blurry vision Side effects that usually do not require medical attention (  report to your care team if they continue or are bothersome): Constipation Dizziness Drowsiness Dry mouth Headache Nausea Vomiting This list may not describe all possible side effects. Call your doctor for medical advice about side effects. You may report side effects to FDA at 1-800-FDA-1088. Where should I keep my medication? Keep this medication out of reach of children and pets. Store it out of sight in a safe place. Do not  share it with others. Misuse of this medication is dangerous and against the law. Store at room temperature between 20 and 25 degrees C (68 and 77 degrees F). Protect from moisture. Get rid of any unused medication after the expiration date. This medication may cause harm and death if it is taken by other adults, children, or pets. It is important to get rid of the medication as soon as you no longer need it or it is expired. To get rid of this medication: Take the medication to a take-back program. Check with your pharmacy or law enforcement to find a location. Follow the steps given to you by your pharmacy. You may be given a pre-paid mail-back envelope or disposal product to safely get rid of your medication. If other options are not available, flush the medication down the toilet. NOTE: This sheet is a summary. It may not cover all possible information. If you have questions about this medicine, talk to your doctor, pharmacist, or health care provider.  2025 Elsevier/Gold Standard (2022-12-09 00:00:00)  VISIT SUMMARY: You visited us  today to address the persistent intercostal pain you have been experiencing since your surgeries for melanoma and lung cancer. We discussed your current medications and potential adjustments to better manage your pain.  YOUR PLAN: CHRONIC NEUROPATHIC PAIN: You have been experiencing chronic neuropathic pain in your left chest area following your surgeries for melanoma and lung cancer. This pain has been persistent for a year and has not been significantly relieved by previous treatments. -Increase your pregabalin  dose to 150 mg twice daily for one month. -We will evaluate your response to the increased pregabalin  dose in one month. -If the pain persists despite medication adjustments, we may consider referring you for a spinal cord stimulator. -We discussed the potential use of tapentadol as an alternative if pregabalin  is insufficient. -If tramadol  is not  providing relief, you should discontinue its use.                      Contains text generated by [employer]bridge.                                 Contains text generated by [employer]bridge.

## 2023-10-19 NOTE — Progress Notes (Signed)
 Subjective:    Patient ID: Samuel Turner, male    DOB: [birth year], 79 y.o.   MRN: [id number]  HPI Discussed the use of AI scribe software for clinical note transcription with the patient, who gave verbal consent to proceed.  History of Present Illness Samuel Turner is a 78 year old male with melanoma and lung cancer who presents with intercostal pain following surgery.  He experiences intercostal pain primarily located at the site of a previous drainage tube on the left chest, following surgeries for melanoma and lung cancer a year ago. The pain is significant and associated with the areas where robotic surgery was performed, including multiple entry points. It has been persistent for a year, with swelling and numbness in the affected areas.  He underwent an upper lobectomy on the left side and had a drainage tube placed, which is a major site of pain. He has had two spinal epidurals at T12-L1 and T7-8, and a lidocaine injection, which provided some diagnostic insight but no significant relief.  His current medications include Lyrica  at 75 mg twice daily, which he has previously tried at a higher dose of 100 mg without success. He also takes tramadol  and has trialed Cymbalta . He experiences dizziness, which he attributes to either the pain or his medications. He has not taken oxycodone , although it was prescribed for a camping trip as a precaution.  He has a history of melanoma, which was treated with surgery and immunotherapy, and lung cancer, for which he underwent surgery. The melanoma was initially removed but recurred in the lymph nodes, which were subsequently treated with immunotherapy before surgery. He has been cancer-free since his last follow-up in the summer.  He experiences numbness and pain in the left chest area, particularly around the site of the drainage tube and surgical scars. The numbness extends to the armpit. He reports a bump or swelling that radiates pain when  touched, and numbness that is painful, described as 'typical neuropathic pain'.  He engages in regular exercise and is cautious of lymphedema due to previous lymph node removal. He avoids blood draws on the left side to prevent complications. No numbness below the belly button and no numbness in the back.    Pain Inventory Average Pain 8 Pain Right Now 7 My pain is constant, sharp, stabbing, tingling, and aching  In the last 24 hours, has pain interfered with the following? General activity 7 Relation with others 6 Enjoyment of life 7 What TIME of day is your pain at its worst? evening Sleep (in general) Good  Pain is worse with: some activites Pain improves with: heat/ice Relief from Meds: 0  walk without assistance how many minutes can you walk? 4-5 miles ability to climb steps?  yes do you drive?  yes Do you have any goals in this area?  yes  retired  No problems in this area  Any changes since last visit?  yes CT/MRI [facility]  Any changes since last visit?  no    Family History  Problem Relation Age of Onset   Ovarian cancer Mother    Heart disease Father    Colon cancer Paternal Grandmother    Esophageal cancer Neg Hx    Stomach cancer Neg Hx    Rectal cancer Neg Hx    Social History   Socioeconomic History   Marital status: Married    Spouse name: Not on file   Number of children: 2   Years of education:  Not on file   Highest education level: Not on file  Occupational History   Occupation: Profeesional Counselor    Employer: [employer]  Tobacco Use   Smoking status: Never   Smokeless tobacco: Never  Vaping Use   Vaping status: Never Used  Substance and Sexual Activity   Alcohol use: Yes    Alcohol/week: 1.0 standard drink of alcohol    Types: 1 Glasses of wine per week   Drug use: No   Sexual activity: Not on file  Other Topics Concern   Not on file  Social History Narrative   Not on file   Social Drivers of Health    Financial Resource Strain: Low Risk  (09/28/2022)   [facility]   Overall Financial Resource Strain (CARDIA)    Difficulty of Paying Living Expenses: Not hard at all  Food Insecurity: No Food Insecurity (04/02/2023)   Received from [facility]   Hunger Vital Sign    Within the past 12 months, you worried that your food would run out before you got the money to buy more.: Never true    Within the past 12 months, the food you bought just didn't last and you didn't have money to get more.: Never true  Transportation Needs: No Transportation Needs (04/02/2023)   Received from [facility] - Transportation    Lack of Transportation (Medical): No    Lack of Transportation (Non-Medical): No  Physical Activity: Sufficiently Active (06/10/2023)   Received from [facility]   Exercise Vital Sign    On average, how many days per week do you engage in moderate to strenuous exercise (like a brisk walk)?: 7 days    On average, how many minutes do you engage in exercise at this level?: 30 min  Stress: No Stress Concern Present (06/10/2023)   Received from [facility] - Occupational Stress Questionnaire    Feeling of Stress : Not at all  Social Connections: Socially Integrated (06/10/2023)   Received from [facility]   Social Connection and Isolation Panel    In a typical week, how many times do you talk on the phone with family, friends, or neighbors?: More than three times a week    How often do you get together with friends or relatives?: More than three times a week    How often do you attend church or religious services?: More than 4 times per year    Do you belong to any clubs or organizations such as church groups, unions, fraternal or athletic groups, or school groups?: Yes    How often do you attend meetings of the clubs or organizations you belong to?: More than 4 times per year    Are you married, widowed,  divorced, separated, never married, or living with a partner?: Married   Past Surgical History:  Procedure Laterality Date   COLONOSCOPY     LEFT HEART CATH  2012   POLYPECTOMY     TONSILLECTOMY     Past Medical History:  Diagnosis Date   Adjustment disorder with depressed mood    Allergic rhinitis    Anxiety    Arthritis    Colon polyps    Depression    Situational    GERD (gastroesophageal reflux disease)    H. pylori infection    Hypercholesterolemia    Hypothyroidism    Insomnia    Prediabetes  Sleep disorder    Squamous cell skin cancer    Testicular hypogonadism    Tubular adenoma 10/28/2007   Ventricular tachycardia (HCC)    6 yrs ago, no issues now   Vitamin D deficiency    BP 124/66   Pulse 70   Wt 178 lb (80.7 kg)   SpO2 98%   BMI 24.14 kg/m   Opioid Risk Score:   Fall Risk Score:  `1  Depression screen [facility] 2/9     10/19/2023   11:24 AM  Depression screen PHQ 2/9  Decreased Interest 0  Down, Depressed, Hopeless 2  PHQ - 2 Score 2  Altered sleeping 0  Tired, decreased energy 2  Change in appetite 0  Feeling bad or failure about yourself  0  Trouble concentrating 0  Moving slowly or fidgety/restless 0  Suicidal thoughts 0  PHQ-9 Score 4    Review of Systems  Gastrointestinal:         Let upper side, also in the intercostal space  All other systems reviewed and are negative.      Objective:   Physical Exam Vitals and nursing note reviewed.  Constitutional:      Appearance: He is normal weight.  HENT:     Head: Normocephalic and atraumatic.  Eyes:     Extraocular Movements: Extraocular movements intact.     Conjunctiva/sclera: Conjunctivae normal.     Pupils: Pupils are equal, round, and reactive to light.  Musculoskeletal:     Cervical back: Normal range of motion.     Right lower leg: No edema.     Left lower leg: No edema.  Skin:    General: Skin is warm and dry.  Neurological:     Mental Status: He is alert and oriented  to person, place, and time.     Cranial Nerves: No dysarthria or facial asymmetry.     Motor: Motor function is intact.     Coordination: Coordination is intact.     Gait: Gait is intact.     Comments: There is numbness in the left subcostal area as well as numbness in the left axilla around incision site.    No pain with right shoulder range of motion with left shoulder range of motion there is some pain with overhead activity mainly in the axillary area.   Physical Exam         Assessment & Plan:  Assessment and Plan Assessment & Plan Chronic neuropathic pain after left upper lobectomy and melanoma surgery Chronic neuropathic pain persists one year post-surgery, primarily in the left chest area. Previous treatments, including spinal epidurals, lidocaine injections, and medications, have had limited success. Current low dose of pregabalin  causes dizziness. Spinal cord stimulation discussed as a future option if medication adjustments fail. Tapentadol discussed as an alternative with less risk of respiratory depression, addiction, and constipation compared to oxycodone . - Increase pregabalin  to 150 mg twice daily for one month. - Evaluate response to increased pregabalin  dose in one month. - Consider referral for spinal cord stimulator if pain persists despite medication adjustments. - Discuss potential use of tapentadol if pregabalin  is insufficient. - Discontinue tramadol  if it is not providing relief.

## 2023-10-25 ENCOUNTER — Encounter (HOSPITAL_BASED_OUTPATIENT_CLINIC_OR_DEPARTMENT_OTHER): Payer: Self-pay

## 2023-10-26 ENCOUNTER — Ambulatory Visit (HOSPITAL_BASED_OUTPATIENT_CLINIC_OR_DEPARTMENT_OTHER): Admitting: Cardiovascular Disease

## 2023-10-26 ENCOUNTER — Other Ambulatory Visit (HOSPITAL_BASED_OUTPATIENT_CLINIC_OR_DEPARTMENT_OTHER): Payer: Self-pay

## 2023-10-26 ENCOUNTER — Ambulatory Visit (HOSPITAL_BASED_OUTPATIENT_CLINIC_OR_DEPARTMENT_OTHER): Admitting: Family

## 2023-10-26 ENCOUNTER — Encounter (HOSPITAL_BASED_OUTPATIENT_CLINIC_OR_DEPARTMENT_OTHER): Payer: Self-pay | Admitting: Cardiovascular Disease

## 2023-10-26 VITALS — BP 146/70 | HR 72 | Ht 72.0 in | Wt 184.0 lb

## 2023-10-26 DIAGNOSIS — E785 Hyperlipidemia, unspecified: Secondary | ICD-10-CM

## 2023-10-26 DIAGNOSIS — I2510 Atherosclerotic heart disease of native coronary artery without angina pectoris: Secondary | ICD-10-CM | POA: Insufficient documentation

## 2023-10-26 DIAGNOSIS — I10 Essential (primary) hypertension: Secondary | ICD-10-CM | POA: Insufficient documentation

## 2023-10-26 DIAGNOSIS — Z720 Tobacco use: Secondary | ICD-10-CM | POA: Diagnosis not present

## 2023-10-26 DIAGNOSIS — I2699 Other pulmonary embolism without acute cor pulmonale: Secondary | ICD-10-CM | POA: Diagnosis not present

## 2023-10-26 MED ORDER — HYDROCHLOROTHIAZIDE 25 MG PO TABS
25.0000 mg | ORAL_TABLET | Freq: Every day | ORAL | 3 refills | Status: DC
  Filled 2023-10-26: qty 90, 90d supply, fill #0

## 2023-10-26 NOTE — Progress Notes (Signed)
 Cardiology Office Note:  .   Date:  10/28/2023  ID:  Samuel Turner, DOB August 24, 1945, MRN [id number] PCP: Arloa Elsie SAUNDERS, MD  [facility] Providers Cardiologist:  Annabella Scarce, MD Cardiology APP:  Vannie Reche RAMAN, NP    History of Present Illness: .    Samuel Turner is a 78 y.o. male with coronary calcification, hypertension, PVCs, tobacco abuse, melanoma and adenocarcinoma of the left upper lobe of the lung status post left upper lobectomy 09/2022, post thoracic pain syndrome, and family history of CAD here for follow-up.  He was previously a patient of Dr. Alvan.  He presented in 2022 with fatigue.  Echo at the time revealed LVEF 55-60% with mild LVH and grade 1 diastolic dysfunction.  There was moderate right and severe left atrial enlargement.  He was admitted 09/2022 with left upper lobectomy.  He had a brief episode of postoperative atrial fibrillation with resolved spontaneously and did not require further intervention.  He was not started on anticoagulation.  He saw Reche Vannie, NP 08/2023 and was doing well.  He had no recurrent atrial fibrillation.  He noted ED attributable to metoprolol .  Blood pressure was uncontrolled despite working with his primary care provider.  He was on irbesartan  and amlodipine  was added without improvement in his blood pressure.  He also noted the pain affects his blood pressure.  At that visit metoprolol  was reduced and amlodipine  was switched to amlodipine /valsartan .  This was increased to 10/320 mg.  Discussed the use of AI scribe software for clinical note transcription with the patient, who gave verbal consent to proceed.  History of Present Illness Samuel Turner experiences persistent pain following surgery for melanoma and stage one lung cancer on the left side, which involved robotic surgery and thoracic drainage. The pain is primarily located at the thoracic drainage site and is described as constant, with an intensity of about four out  of ten. Previous interventions, including two epidural injections and local lidocaine injections, have not provided significant relief. He is currently taking Lyrica , but its efficacy in reducing pain is uncertain.  He has a history of hypertension, with home blood pressure readings typically around 150/78 mmHg. A recent reading at a physical medicine and rehab center showed 127 mmHg systolic, which he attributes to potential equipment differences. He is on amlodipine  and valsartan , with recent dosage adjustments, but has not noticed significant changes in his blood pressure. His diet is generally good, with minimal salt intake, and he monitors his blood pressure regularly at home.  He experiences shortness of breath, particularly with exertion, attributed to the removal of the upper left lung lobe. He practices breathing exercises to manage this and reports stable lung capacity over the past six months. He engages in physical activities like hiking, adjusting his pace to manage breathlessness. He denies experiencing chest pain or pressure during exercise.  He has a history of atrial fibrillation following lung surgery, for which he is taking Eliquis  due to a detected small clot on a PET scan. He also has a past history of VTAC approximately twenty years ago, which resolved after lifestyle changes.  He reports occasional dizziness and spaciness, particularly when moving from sitting to standing, but denies vertigo. This symptom is not present during physical exertion like hiking.  ROS:  As per HPI  Studies Reviewed: SABRA   EKG Interpretation Date/Time:  Tuesday October 26 2023 12:15:10 EDT Ventricular Rate:  72 PR Interval:  176 QRS Duration:  92 QT Interval:  368 QTC Calculation: 402 R Axis:   50  Text Interpretation: Normal sinus rhythm Possible Left atrial enlargement Minimal voltage criteria for LVH, may be normal variant ( Sokolow-Lyon ) When compared with ECG of 10-Oct-2022 10:23, No  significant change was found Confirmed by Raford Riggs [phone]) on 10/26/2023 10:51:51 PM   Coronary CT-A 08/31/23 IMPRESSION: 1. Coronary calcium score of 487. This was 75 percentile for age-, sex, and race-matched controls.   2. Total plaque volume 764 mm3 which is 54 percentile for age- and sex-matched controls (calcified plaque 146 mm3; non-calcified plaque 618 mm3). TPV is (extensive).   3. Normal coronary origin with right dominance.   4. Mild to moderate stenosis in the RCA and LAD, non flow-limiting.   5. There is a small 1 cm diameter thrombus at the termination point of the left upper pulmonary vein that was ligated during left upper lobectomy. It is adjacent to suture material. Consider anticoagulation.  Risk Assessment/Calculations:    CHA2DS2-VASc Score = 4   This indicates a 4.8% annual risk of stroke. The patient's score is based upon: CHF History: 0 HTN History: 1 Diabetes History: 0 Stroke History: 0 Vascular Disease History: 1 Age Score: 2 Gender Score: 0        Physical Exam:   VS:  BP (!) 146/70   Pulse 72   Ht 6' (1.829 m)   Wt 184 lb (83.5 kg)   SpO2 97%   BMI 24.95 kg/m  , BMI Body mass index is 24.95 kg/m. GENERAL:  Well appearing HEENT: Pupils equal round and reactive, fundi not visualized, oral mucosa unremarkable NECK:  No jugular venous distention, waveform within normal limits, carotid upstroke brisk and symmetric, no bruits, no thyromegaly LUNGS:  Clear to auscultation bilaterally HEART:  RRR.  PMI not displaced or sustained,S1 and S2 within normal limits, no S3, no S4, no clicks, no rubs, no murmurs ABD:  Flat, positive bowel sounds normal in frequency in pitch, no bruits, no rebound, no guarding, no midline pulsatile mass, no hepatomegaly, no splenomegaly EXT:  2 plus pulses throughout, no edema, no cyanosis no clubbing SKIN:  No rashes no nodules NEURO:  Cranial nerves II through XII grossly intact, motor grossly intact  throughout PSYCH:  Cognitively intact, oriented to person place and time   ASSESSMENT AND PLAN: .    Assessment & Plan # Essential hypertension, suboptimally controlled Blood pressure remains around 150/70 mmHg despite amlodipine  and valsartan . Discussed need for additional medication for better control. Explained typical antihypertensive effect and necessity for multiple medications. - Add hydrochlorothiazide 25 mg to current regimen. - Check basic metabolic panel in one week to monitor potassium levels. - Continue current diet and exercise regimen.  # Atherosclerotic heart disease of native coronary artery without angina # Hyperlipidemia:  Atherosclerotic heart disease without current angina. No symptoms suggestive of angina or acute coronary syndrome. - Continue Zetia .  # Post-op atrial fibrillation:  No recurrence since surgery.  No anticoagulation.  Pulmonary vein thrombus:  Pulmonary thrombus identified in August. At the site of prior surgery. On Eliquis  for anticoagulation. Discussed hypercoagulable state due to recent cancer and surgery. Explained clot resolution with Eliquis . - Continue Eliquis  for anticoagulation x3 months. - Reimage in three months to assess resolution of pulmonary thrombus.  # Status post left upper lobectomy for lung cancer, in remission Status post left upper lobectomy for lung cancer, currently in remission.  # Shortness of breath after lobectomy Shortness of breath persists post-lobectomy, likely due to reduced  lung capacity. Managed with breathing exercises and activity modification. - Continue breathing exercises and activity modification.  # Chronic pain after thoracic surgery Chronic pain persists at thoracic drainage site. Lyrica  used for pain management, effectiveness uncertain. Previous interventions ineffective. Plan to evaluate further with EMG and nerve conduction studies. - Continue Lyrica  for pain management. - Perform EMG and nerve  conduction studies to further evaluate pain.  # Dizziness, under evaluation Intermittent dizziness, not associated with vertigo. Lyrica  identified as potential cause. Explained Lyrica  can cause dizziness. Blood pressure in 140s unlikely cause. - Monitor symptoms and consider Lyrica  as a potential cause of dizziness. - He was not orthostatic on exam today.  Blood pressures remained elevated        Dispo: f/u 1-2 months with PharmD .  4 months with me.   Signed, Annabella Scarce, MD

## 2023-10-26 NOTE — Patient Instructions (Addendum)
 Medication Instructions:  START hydrochlorothiazide 25 MG DAILY   *If you need a refill on your cardiac medications before your next appointment, please call your pharmacy*  Lab Work: BMET IN 1 WEEK   If you have labs (blood work) drawn today and your tests are completely normal, you will receive your results only by: MyChart Message (if you have MyChart) OR A paper copy in the mail If you have any lab test that is abnormal or we need to change your treatment, we will call you to review the results.  Testing/Procedures: Non-Cardiac CT Angiography (CTA), is a special type of CT scan that uses a computer to produce multi-dimensional views of major blood vessels throughout the body. In CT angiography, a contrast material is injected through an IV to help visualize the blood vessels OF CHEST END OF OCTOBER OR FIRST OF NOVEMBER   Follow-Up: At [facility], you and your health needs are our priority.  As part of our continuing mission to provide you with exceptional heart care, our providers are all part of one team.  This team includes your primary Cardiologist (physician) and Advanced Practice Providers or APPs (Physician Assistants and Nurse Practitioners) who all work together to provide you with the care you need, when you need it.  Your next appointment:   You have been referred to [facility] IN 1 TO 2 MONTHS   DR [facility] IN 4 MONTHS   We recommend signing up for the patient portal called MyChart.  Sign up information is provided on this After Visit Summary.  MyChart is used to connect with patients for Virtual Visits (Telemedicine).  Patients are able to view lab/test results, encounter notes, upcoming appointments, etc.  Non-urgent messages can be sent to your provider as well.   To learn more about what you can do with MyChart, go to [email or web address].

## 2023-10-28 ENCOUNTER — Encounter (HOSPITAL_BASED_OUTPATIENT_CLINIC_OR_DEPARTMENT_OTHER): Payer: Self-pay | Admitting: Cardiovascular Disease

## 2023-11-03 ENCOUNTER — Encounter (HOSPITAL_BASED_OUTPATIENT_CLINIC_OR_DEPARTMENT_OTHER): Payer: Self-pay | Admitting: Cardiovascular Disease

## 2023-11-03 ENCOUNTER — Telehealth (HOSPITAL_BASED_OUTPATIENT_CLINIC_OR_DEPARTMENT_OTHER): Payer: Self-pay | Admitting: Cardiovascular Disease

## 2023-11-03 DIAGNOSIS — I2510 Atherosclerotic heart disease of native coronary artery without angina pectoris: Secondary | ICD-10-CM | POA: Diagnosis not present

## 2023-11-03 NOTE — Telephone Encounter (Signed)
 Pt stopped by to have abs printed again. He also mentioned he would like a call pertaining to eliquis . He says the $40 copay is high and would like to discuss cheaper options. Please call when you can.

## 2023-11-03 NOTE — Telephone Encounter (Signed)
 Left message to call back.

## 2023-11-04 ENCOUNTER — Other Ambulatory Visit (HOSPITAL_COMMUNITY): Payer: Self-pay

## 2023-11-04 LAB — BASIC METABOLIC PANEL WITH GFR
BUN/Creatinine Ratio: 19 (ref 10–24)
BUN: 22 mg/dL (ref 8–27)
CO2: 25 mmol/L (ref 20–29)
Calcium: 10.1 mg/dL (ref 8.6–10.2)
Chloride: 100 mmol/L (ref 96–106)
Creatinine, Ser: 1.18 mg/dL (ref 0.76–1.27)
Glucose: 97 mg/dL (ref 70–99)
Potassium: 4.3 mmol/L (ref 3.5–5.2)
Sodium: 140 mmol/L (ref 134–144)
eGFR: 64 mL/min/1.73 (ref >59)

## 2023-11-04 NOTE — Telephone Encounter (Signed)
 Patient has a standard copay for this medication. It doesn't look like deductible is being applied. Assistance option requires 3% out of pocket spending on drugs for this year. Routing to PharmD to see if therapy change is appropriate.

## 2023-11-04 NOTE — Telephone Encounter (Signed)
 Will route to med assist team to see if qualifies for tier reduction or patient assistance.

## 2023-11-04 NOTE — Telephone Encounter (Signed)
 Patient is returning call.

## 2023-11-05 NOTE — Telephone Encounter (Signed)
 Attempted phone call to pt and left voicemail message (OK per EPIC) Advised of information below per pharmacy.  Pt advised if he has any further questions or concerns to contact office at [phone].

## 2023-11-05 NOTE — Telephone Encounter (Signed)
 There really isnt much cheaper. He could do warfarin, which would be free, but then he would have to come in and get his INR checked regularly and there might be copay associated with that.

## 2023-11-09 ENCOUNTER — Telehealth (HOSPITAL_BASED_OUTPATIENT_CLINIC_OR_DEPARTMENT_OTHER): Payer: Self-pay | Admitting: Cardiovascular Disease

## 2023-11-09 ENCOUNTER — Other Ambulatory Visit (HOSPITAL_BASED_OUTPATIENT_CLINIC_OR_DEPARTMENT_OTHER): Payer: Self-pay

## 2023-11-09 ENCOUNTER — Ambulatory Visit (HOSPITAL_BASED_OUTPATIENT_CLINIC_OR_DEPARTMENT_OTHER)
Admission: RE | Admit: 2023-11-09 | Discharge: 2023-11-09 | Disposition: A | Discharge status: Home / Self Care | Source: Ambulatory Visit | Attending: Cardiovascular Disease | Admitting: Cardiovascular Disease

## 2023-11-09 DIAGNOSIS — I2510 Atherosclerotic heart disease of native coronary artery without angina pectoris: Secondary | ICD-10-CM | POA: Insufficient documentation

## 2023-11-09 DIAGNOSIS — I2699 Other pulmonary embolism without acute cor pulmonale: Secondary | ICD-10-CM | POA: Insufficient documentation

## 2023-11-09 DIAGNOSIS — J439 Emphysema, unspecified: Secondary | ICD-10-CM | POA: Diagnosis not present

## 2023-11-09 DIAGNOSIS — I700 Atherosclerosis of aorta: Secondary | ICD-10-CM | POA: Diagnosis not present

## 2023-11-09 MED ORDER — IOHEXOL 350 MG/ML SOLN
75.0000 mL | Freq: Once | INTRAVENOUS | Status: AC | PRN
  Administered 2023-11-09: 75 mL via INTRAVENOUS

## 2023-11-09 NOTE — Telephone Encounter (Signed)
 Pt in office today for CT Chest Angiogram and dropped off BP readings.  From OV with Dr. Raford 10/26/23:  He reports occasional dizziness and spaciness, particularly when moving from sitting to standing, but denies vertigo. This symptom is not present during physical exertion like hiking.   # Dizziness, under evaluation Intermittent dizziness, not associated with vertigo. Lyrica  identified as potential cause. Explained Lyrica  can cause dizziness. Blood pressure in 140s unlikely cause. - Monitor symptoms and consider Lyrica  as a potential cause of dizziness. - He was not orthostatic on exam today.  Blood pressures remained elevated

## 2023-11-09 NOTE — Telephone Encounter (Signed)
 Pt stopped by with some bp readings 125/56 and 132/53 pulse between 85 and 88. He is experiencing light headedness and dizziness. Please call and advise when you can. He did mention he was going to send a fpl group.

## 2023-11-15 ENCOUNTER — Encounter (HOSPITAL_BASED_OUTPATIENT_CLINIC_OR_DEPARTMENT_OTHER): Payer: Self-pay | Admitting: Cardiovascular Disease

## 2023-11-15 ENCOUNTER — Ambulatory Visit (HOSPITAL_BASED_OUTPATIENT_CLINIC_OR_DEPARTMENT_OTHER): Payer: Self-pay | Admitting: Family

## 2023-11-15 MED ORDER — APIXABAN 5 MG PO TABS: 5.0000 mg | ORAL_TABLET | Freq: Two times a day (BID) | ORAL | Status: AC

## 2023-11-15 NOTE — Telephone Encounter (Signed)
 Left message for patient to call back

## 2023-11-15 NOTE — Telephone Encounter (Signed)
 Spoke w patient.  He was planning to send readings but has not gotten around to it.  He states occas he gets lightheaded when he stands quickly but otherwise he is going to talk w rehab doctor who prescribed Lyrica .  I told him I'd send a message to that prescriber for review.    Regarding the CT scan - I reviewed the message from Caitlin w him.  He voices understanding and agreement.  Updated medication list.

## 2023-11-16 ENCOUNTER — Other Ambulatory Visit (HOSPITAL_BASED_OUTPATIENT_CLINIC_OR_DEPARTMENT_OTHER): Payer: Self-pay

## 2023-11-16 ENCOUNTER — Other Ambulatory Visit (HOSPITAL_COMMUNITY): Payer: Self-pay

## 2023-11-16 DIAGNOSIS — E291 Testicular hypofunction: Secondary | ICD-10-CM | POA: Diagnosis not present

## 2023-11-17 ENCOUNTER — Other Ambulatory Visit (HOSPITAL_BASED_OUTPATIENT_CLINIC_OR_DEPARTMENT_OTHER): Payer: Self-pay

## 2023-11-17 ENCOUNTER — Other Ambulatory Visit: Payer: Self-pay

## 2023-11-17 MED ORDER — ALPRAZOLAM 0.5 MG PO TABS
0.5000 mg | ORAL_TABLET | Freq: Every day | ORAL | 1 refills | Status: AC
  Filled 2023-11-17: qty 90, 90d supply, fill #0
  Filled 2024-02-11: qty 90, 90d supply, fill #1
  Filled ????-??-??: fill #1

## 2023-11-19 ENCOUNTER — Encounter: Admitting: Physical Medicine & Rehabilitation

## 2023-11-23 ENCOUNTER — Encounter: Admitting: Physical Medicine & Rehabilitation

## 2023-11-26 ENCOUNTER — Other Ambulatory Visit (HOSPITAL_BASED_OUTPATIENT_CLINIC_OR_DEPARTMENT_OTHER): Payer: Self-pay

## 2023-11-30 DIAGNOSIS — R0782 Intercostal pain: Secondary | ICD-10-CM | POA: Diagnosis not present

## 2023-12-01 DIAGNOSIS — E291 Testicular hypofunction: Secondary | ICD-10-CM | POA: Diagnosis not present

## 2023-12-01 NOTE — Progress Notes (Unsigned)
 Patient ID: Samuel Turner                 DOB: [birth year]                      MRN: [id number]      HPI: Samuel Turner is a 78 y.o. male referred by Dr. Raford  to HTN clinic. PMH is significant for  Current HTN meds:  Previously tried:  BP goal:   Family History:   Social History:   Diet:   Exercise:  {types:28256}  Home BP readings:  Date SBP/DBP  HR              Average      Wt Readings from Last 3 Encounters:  10/26/23 184 lb (83.5 kg)  10/19/23 178 lb (80.7 kg)  08/17/23 182 lb 8 oz (82.8 kg)   BP Readings from Last 3 Encounters:  10/26/23 (!) 146/70  10/19/23 124/66  08/31/23 (!) 142/78   Pulse Readings from Last 3 Encounters:  10/26/23 72  10/19/23 70  08/31/23 70    Renal function: CrCl cannot be calculated (Patient's most recent lab result is older than the maximum 21 days allowed.).  Past Medical History:  Diagnosis Date   Adjustment disorder with depressed mood    Allergic rhinitis    Anxiety    Arthritis    Colon polyps    Coronary artery calcification 10/26/2023   Depression    Situational    GERD (gastroesophageal reflux disease)    H. pylori infection    Hypercholesterolemia    Hypothyroidism    Insomnia    Melanoma (HCC)    surgery at [facility]   Prediabetes    Primary hypertension 10/26/2023   Sleep disorder    Squamous cell skin cancer    Testicular hypogonadism    Tobacco abuse 10/26/2023   Tubular adenoma 10/28/2007   Ventricular tachycardia (HCC)    6 yrs ago, no issues now   Vitamin D deficiency     Current Outpatient Medications on File Prior to Visit  Medication Sig Dispense Refill   acetaminophen (TYLENOL) 500 MG tablet Take 500 mg by mouth.     albuterol  (VENTOLIN  HFA) 108 (90 Base) MCG/ACT inhaler Inhale 2 puffs into the lungs as directed.     ALPRAZolam  (XANAX ) 0.5 MG tablet Take 0.5 mg by mouth at bedtime as needed for sleep.     ALPRAZolam  (XANAX ) 0.5 MG tablet Take 1 tablet (0.5 mg total) by  mouth daily. 90 tablet 1   amLODipine -valsartan  (EXFORGE ) 10-320 MG tablet Take 1 tablet by mouth daily. 90 tablet 3   apixaban  (ELIQUIS ) 5 MG TABS tablet Take 1 tablet (5 mg total) by mouth 2 (two) times daily for 20 days.     Cholecalciferol 50 MCG (2000 UT) CAPS 1 tablet Orally Once a day     ezetimibe  (ZETIA ) 10 MG tablet Take 1 tablet (10 mg total) by mouth daily. 90 tablet 3   Glucosamine 500 MG CAPS Take 1 capsule by mouth daily.     hydrochlorothiazide  (HYDRODIURIL ) 25 MG tablet Take 1 tablet (25 mg total) by mouth daily. 90 tablet 3   Multiple Vitamin (MULTIVITAMIN) tablet Take 1 tablet by mouth daily.     naloxone  (NARCAN ) nasal spray 4 mg/0.1 mL One spray in either nostril once for known/suspected opioid overdose. May repeat every 2-3 minutes in alternating nostril til EMS arrives 2 each 1   Omega 3  340 MG CPDR Omega 3     pregabalin  (LYRICA ) 150 MG capsule Take 1 capsule (150 mg total) by mouth 2 (two) times daily. 60 capsule 1   testosterone  cypionate (DEPOTESTOSTERONE CYPIONATE) 200 MG/ML injection 1  ml     tiZANidine  (ZANAFLEX ) 2 MG tablet Take 1 tablet (2 mg total) by mouth at bedtime as needed. 30 tablet 3   zolpidem  (AMBIEN ) 10 MG tablet Take 1 tablet (10 mg total) by mouth at bedtime as needed for insomnia. 90 tablet 1   [DISCONTINUED] amitriptyline  (ELAVIL ) 25 MG tablet Take 1 tablet (25 mg total) by mouth at bedtime. 30 tablet 5   No current facility-administered medications on file prior to visit.    Allergies  Allergen Reactions   Corticosteroids Other (See Comments)    irritability   Prednisone Swelling and Anxiety    Pt reports anxiety, insomnia, irritability   Other Reaction(s): Angioedema    Pt reports anxiety, insomnia, irritability    More of reaction    There were no vitals taken for this visit.   Assessment/Plan:  1. Hypertension -  No problems updated. No problem-specific Assessment & Plan notes found for this encounter.      Thank  you  Robbi Blanch, Pharm.D [place] [facility]IR[facility]. [facility] [address], [place], [place] [address] Phone: [phone]; Fax: [phone]

## 2023-12-06 DIAGNOSIS — Z79891 Long term (current) use of opiate analgesic: Secondary | ICD-10-CM | POA: Diagnosis not present

## 2023-12-06 DIAGNOSIS — Z87891 Personal history of nicotine dependence: Secondary | ICD-10-CM | POA: Diagnosis not present

## 2023-12-06 DIAGNOSIS — N281 Cyst of kidney, acquired: Secondary | ICD-10-CM | POA: Diagnosis not present

## 2023-12-06 DIAGNOSIS — I2699 Other pulmonary embolism without acute cor pulmonale: Secondary | ICD-10-CM | POA: Diagnosis not present

## 2023-12-06 DIAGNOSIS — Z791 Long term (current) use of non-steroidal anti-inflammatories (NSAID): Secondary | ICD-10-CM | POA: Diagnosis not present

## 2023-12-06 DIAGNOSIS — G8922 Chronic post-thoracotomy pain: Secondary | ICD-10-CM | POA: Diagnosis not present

## 2023-12-06 DIAGNOSIS — C4359 Malignant melanoma of other part of trunk: Secondary | ICD-10-CM | POA: Diagnosis not present

## 2023-12-06 DIAGNOSIS — C3411 Malignant neoplasm of upper lobe, right bronchus or lung: Secondary | ICD-10-CM | POA: Diagnosis not present

## 2023-12-06 DIAGNOSIS — R590 Localized enlarged lymph nodes: Secondary | ICD-10-CM | POA: Diagnosis not present

## 2023-12-06 DIAGNOSIS — C3412 Malignant neoplasm of upper lobe, left bronchus or lung: Secondary | ICD-10-CM | POA: Diagnosis not present

## 2023-12-07 ENCOUNTER — Ambulatory Visit: Attending: Cardiology | Admitting: Pharmacist

## 2023-12-07 ENCOUNTER — Encounter: Payer: Self-pay | Admitting: Pharmacist

## 2023-12-07 VITALS — BP 142/66 | HR 83

## 2023-12-07 DIAGNOSIS — I10 Essential (primary) hypertension: Secondary | ICD-10-CM

## 2023-12-07 NOTE — Assessment & Plan Note (Signed)
 Assessment: In office BP 142/66 mmHg  1st reading 144/67 above the goal (<130/80). Heart rate 83  Patient takes current BP meds regularly and tolerates them well   Denies SOB, palpitation, chest pain, headaches,or swelling Post cancer neuralgia leads to variation in home BP - pt forgot to bring log  Patient admits that there is some room for improvement in salt intake  Pt was on beta-blocker (metoprolol ) stopped it due to ED   Plan:  Patient wants to lower salt intake before making any changes to the medications  Continue taking  amlodipine /valsartan  10/320 mg daily and hydrochlorothiazide  25 mg daily  Patient to keep record of BP readings with heart rate and report to us  at the next visit Patient to see PharmD in 8 weeks for follow up  Follow up lab(s) none  In future can consider switching hydrochlorothiazide  to chlorthalidone for better response or add spironolactone

## 2023-12-07 NOTE — Patient Instructions (Signed)
  Bring all of your meds, your BP cuff and your record of home blood pressures to your next appointment.    HOW TO TAKE YOUR BLOOD PRESSURE AT HOME  Rest 5 minutes before taking your blood pressure.  Don't smoke or drink caffeinated beverages for at least 30 minutes before. Take your blood pressure before (not after) you eat. Sit comfortably with your back supported and both feet on the floor (don't cross your legs). Elevate your arm to heart level on a table or a desk. Use the proper sized cuff. It should fit smoothly and snugly around your bare upper arm. There should be enough room to slip a fingertip under the cuff. The bottom edge of the cuff should be 1 inch above the crease of the elbow. Ideally, take 3 measurements at one sitting and record the average.  Important lifestyle changes to control high blood pressure  Intervention  Effect on the BP  Lose extra pounds and watch your waistline Weight loss is one of the most effective lifestyle changes for controlling blood pressure. If you're overweight or obese, losing even a small amount of weight can help reduce blood pressure. Blood pressure might go down by about 1 millimeter of mercury (mm Hg) with each kilogram (about 2.2 pounds) of weight lost.  Exercise regularly As a general goal, aim for at least 30 minutes of moderate physical activity every day. Regular physical activity can lower high blood pressure by about 5 to 8 mm Hg.  Eat a healthy diet Eating a diet rich in whole grains, fruits, vegetables, and low-fat dairy products and low in saturated fat and cholesterol. A healthy diet can lower high blood pressure by up to 11 mm Hg.  Reduce salt (sodium) in your diet Even a small reduction of sodium in the diet can improve heart health and reduce high blood pressure by about 5 to 6 mm Hg.  Limit alcohol One drink equals 12 ounces of beer, 5 ounces of wine, or 1.5 ounces of 80-proof liquor.  Limiting alcohol to less than one drink a  day for women or two drinks a day for men can help lower blood pressure by about 4 mm Hg.   If you have any questions or concerns please use My Chart to send questions or call the office at [phone]

## 2023-12-09 DIAGNOSIS — H43812 Vitreous degeneration, left eye: Secondary | ICD-10-CM | POA: Diagnosis not present

## 2023-12-16 ENCOUNTER — Encounter: Admitting: Physical Medicine & Rehabilitation

## 2023-12-16 DIAGNOSIS — E291 Testicular hypofunction: Secondary | ICD-10-CM | POA: Diagnosis not present

## 2023-12-23 ENCOUNTER — Ambulatory Visit (HOSPITAL_BASED_OUTPATIENT_CLINIC_OR_DEPARTMENT_OTHER): Admitting: Cardiovascular Disease

## 2023-12-24 ENCOUNTER — Other Ambulatory Visit (HOSPITAL_BASED_OUTPATIENT_CLINIC_OR_DEPARTMENT_OTHER): Payer: Self-pay

## 2023-12-28 ENCOUNTER — Other Ambulatory Visit (HOSPITAL_BASED_OUTPATIENT_CLINIC_OR_DEPARTMENT_OTHER): Payer: Self-pay

## 2023-12-29 ENCOUNTER — Other Ambulatory Visit (HOSPITAL_BASED_OUTPATIENT_CLINIC_OR_DEPARTMENT_OTHER): Payer: Self-pay

## 2023-12-29 MED ORDER — OSELTAMIVIR PHOSPHATE 75 MG PO CAPS
75.0000 mg | ORAL_CAPSULE | Freq: Two times a day (BID) | ORAL | 0 refills | Status: AC
  Filled 2023-12-29: qty 10, 5d supply, fill #0

## 2023-12-31 ENCOUNTER — Other Ambulatory Visit (HOSPITAL_BASED_OUTPATIENT_CLINIC_OR_DEPARTMENT_OTHER): Payer: Self-pay

## 2023-12-31 MED ORDER — ALBUTEROL SULFATE HFA 108 (90 BASE) MCG/ACT IN AERS
1.0000 | INHALATION_SPRAY | Freq: Four times a day (QID) | RESPIRATORY_TRACT | 0 refills | Status: AC | PRN
  Filled 2023-12-31: qty 6.7, 25d supply, fill #0

## 2023-12-31 MED ORDER — DOXYCYCLINE HYCLATE 100 MG PO TABS
100.0000 mg | ORAL_TABLET | Freq: Two times a day (BID) | ORAL | 0 refills | Status: DC
  Filled 2023-12-31: qty 14, 7d supply, fill #0

## 2024-01-11 ENCOUNTER — Other Ambulatory Visit (HOSPITAL_BASED_OUTPATIENT_CLINIC_OR_DEPARTMENT_OTHER): Payer: Self-pay

## 2024-01-17 ENCOUNTER — Other Ambulatory Visit (HOSPITAL_BASED_OUTPATIENT_CLINIC_OR_DEPARTMENT_OTHER): Payer: Self-pay

## 2024-01-21 ENCOUNTER — Ambulatory Visit: Admitting: Pharmacist

## 2024-01-21 ENCOUNTER — Encounter: Payer: Self-pay | Admitting: Pharmacist

## 2024-01-21 ENCOUNTER — Other Ambulatory Visit (HOSPITAL_COMMUNITY): Payer: Self-pay

## 2024-01-21 VITALS — BP 133/68 | HR 87

## 2024-01-21 DIAGNOSIS — I10 Essential (primary) hypertension: Secondary | ICD-10-CM

## 2024-01-21 MED ORDER — CHLORTHALIDONE 25 MG PO TABS
25.0000 mg | ORAL_TABLET | Freq: Every day | ORAL | 3 refills | Status: AC
  Filled 2024-01-21: qty 90, 90d supply, fill #0

## 2024-01-21 NOTE — Assessment & Plan Note (Signed)
 Assessment: In office BP 133/68 heart rate 87  goal (<130/80).   Patient takes current BP meds regularly and tolerates them well   Denies SOB, palpitation, chest pain, headaches,or swelling Post cancer neuralgia leads to variation in home BP - pt forgot to bring log  Cut back on salt intake  Pt was on beta-blocker (metoprolol ) stopped it due to ED   Plan:  Stop hydrochlorothiazide  25 mg and start taking chlorthalidone  25 mg daily  Continue taking  amlodipine /valsartan  10/320 mg daily and  Patient to keep record of BP readings with heart rate and report to us  at the next visit Patient to see PharmD in 4 weeks for follow up  Follow up lab(s) BMP In future can consider  adding spironolactone

## 2024-01-21 NOTE — Patient Instructions (Addendum)
 Changes made by your pharmacist Robbi Blanch, PharmD at today's visit:    Instructions/Changes  (what do you need to do) Your Notes  (what you did and when you did it)  Stop taking hydrochlorothiazide  25 mg and start taking chlorthalidone  25 mg daily    Continue taking amlodipine /valsartan  10/320 mg daily    Continue following low salt diet and regular exercise     Bring all of your meds, your BP cuff and your record of home blood pressures to your next appointment.    HOW TO TAKE YOUR BLOOD PRESSURE AT HOME  Rest 5 minutes before taking your blood pressure.  Dont smoke or drink caffeinated beverages for at least 30 minutes before. Take your blood pressure before (not after) you eat. Sit comfortably with your back supported and both feet on the floor (dont cross your legs). Elevate your arm to heart level on a table or a desk. Use the proper sized cuff. It should fit smoothly and snugly around your bare upper arm. There should be enough room to slip a fingertip under the cuff. The bottom edge of the cuff should be 1 inch above the crease of the elbow. Ideally, take 3 measurements at one sitting and record the average.  Important lifestyle changes to control high blood pressure  Intervention  Effect on the BP  Lose extra pounds and watch your waistline Weight loss is one of the most effective lifestyle changes for controlling blood pressure. If you're overweight or obese, losing even a small amount of weight can help reduce blood pressure. Blood pressure might go down by about 1 millimeter of mercury (mm Hg) with each kilogram (about 2.2 pounds) of weight lost.  Exercise regularly As a general goal, aim for at least 30 minutes of moderate physical activity every day. Regular physical activity can lower high blood pressure by about 5 to 8 mm Hg.  Eat a healthy diet Eating a diet rich in whole grains, fruits, vegetables, and low-fat dairy products and low in saturated fat and  cholesterol. A healthy diet can lower high blood pressure by up to 11 mm Hg.  Reduce salt (sodium) in your diet Even a small reduction of sodium in the diet can improve heart health and reduce high blood pressure by about 5 to 6 mm Hg.  Limit alcohol One drink equals 12 ounces of beer, 5 ounces of wine, or 1.5 ounces of 80-proof liquor.  Limiting alcohol to less than one drink a day for women or two drinks a day for men can help lower blood pressure by about 4 mm Hg.   If you have any questions or concerns please use My Chart to send questions or call the office at [phone]

## 2024-01-21 NOTE — Progress Notes (Signed)
 Patient ID: Samuel Turner                 DOB: 1945-10-30                      MRN: [id number]      HPI: Samuel Turner is a 79 y.o. male referred by Dr. Raford  to HTN clinic. PMH is significant for hypertension, tobacco abuse, coronary calcification, PVCs,melanoma and adenocarcinoma of the left upper lobe of the lung status post left upper lobectomy 09/2022, post thoracic pain syndrome, and family history of CAD  He saw Reche Finder, NP 08/2023 and was doing well. He had no recurrent atrial fibrillation. He noted ED attributable to metoprolol . Blood pressure was uncontrolled despite working with his primary care provider. He was on irbesartan  and amlodipine  was added without improvement in his blood pressure. He also noted the pain affects his blood pressure. At that visit metoprolol  was reduced and amlodipine  was switched to amlodipine /valsartan . This was increased to 10/320 mg.  At last visit with Dr.[place] hydrochlorothiazide  was added to amlodipine  and valsartan .  At last visit on 12/07/2023 --- his home BP monitor is approximately three years old. He notes significant variability in home BP readings, which he attributes to fluctuations in neurological pain. Patient states he takes and tolerates his current medications well. He does not add salt to most foods but does not actively monitor sodium intake from snacks. Recently underwent a PET scan for cancer, which was negative, and he expressed relief. Patient reports feeling stressed during some office visits, which may contribute to elevated readings in clinic. His systolic BP has consistently been challenging to control, while diastolic BP remains below 70 mmHg. Patient had decided to make some lifestyle interventions before adding more medication.  01/21/2024 - Today patient presented for BP follow up forgot to bring BP log but reports his home BP ~150/78 range can't remember HR. He has cut back on salty snacks. Tolerates current meds well.  Experiencing CNS s/e from Lyrica . Will be taking to PCP about dose adjustment.   Current HTN meds: amlodipine /valsartan  10/320 mg daily and hydrochlorothiazide  25 mg daily  Previously tried: irbesartan  300 mg daily and metoprolol  50 mg twice daily - ED from [facility]  BP goal: <130/80   Family History:  Relation Problem Comments  Mother (Deceased) Ovarian cancer     Father (Deceased) Heart attack   Heart disease     Paternal Grandmother (Deceased) Colon cancer     Social History:  Smoking : none Alcohol: 1-2 glass of wine per day  Diet: does not eat out, seldom use of canned food, does not add salt to food but may be using seasoning that has some salt in it.  Water - 4- glasses 8 oz  Exercise: 30 min walks every day and resistance exercise 2-3 times per week   Home BP readings: varies a lot did not bring home BP log    Wt Readings from Last 3 Encounters:  10/26/23 184 lb (83.5 kg)  10/19/23 178 lb (80.7 kg)  08/17/23 182 lb 8 oz (82.8 kg)   BP Readings from Last 3 Encounters:  01/21/24 133/68  12/07/23 (!) 142/66  10/26/23 (!) 146/70   Pulse Readings from Last 3 Encounters:  01/21/24 87  12/07/23 83  10/26/23 72    Renal function: CrCl cannot be calculated (Patient's most recent lab result is older than the maximum 21 days allowed.).  Past Medical History:  Diagnosis  Date   Adjustment disorder with depressed mood    Allergic rhinitis    Anxiety    Arthritis    Colon polyps    Coronary artery calcification 10/26/2023   Depression    Situational    GERD (gastroesophageal reflux disease)    H. pylori infection    Hypercholesterolemia    Hypothyroidism    Insomnia    Melanoma (HCC)    surgery at [facility]   Prediabetes    Primary hypertension 10/26/2023   Sleep disorder    Squamous cell skin cancer    Testicular hypogonadism    Tobacco abuse 10/26/2023   Tubular adenoma 10/28/2007   Ventricular tachycardia (HCC)    6 yrs ago, no issues now    Vitamin D deficiency     Current Outpatient Medications on File Prior to Visit  Medication Sig Dispense Refill   acetaminophen (TYLENOL) 500 MG tablet Take 500 mg by mouth.     albuterol  (VENTOLIN  HFA) 108 (90 Base) MCG/ACT inhaler Inhale 2 puffs into the lungs as directed.     albuterol  (VENTOLIN  HFA) 108 (90 Base) MCG/ACT inhaler Inhale 1-2 puffs into the lungs every 6 (six) hours as needed. 6.7 g 0   ALPRAZolam  (XANAX ) 0.5 MG tablet Take 0.5 mg by mouth at bedtime as needed for sleep.     ALPRAZolam  (XANAX ) 0.5 MG tablet Take 1 tablet (0.5 mg total) by mouth daily. 90 tablet 1   amLODipine -valsartan  (EXFORGE ) 10-320 MG tablet Take 1 tablet by mouth daily. 90 tablet 3   apixaban  (ELIQUIS ) 5 MG TABS tablet Take 1 tablet (5 mg total) by mouth 2 (two) times daily for 20 days.     Cholecalciferol 50 MCG (2000 UT) CAPS 1 tablet Orally Once a day     doxycycline  (VIBRA -TABS) 100 MG tablet Take 1 tablet (100 mg total) by mouth 2 (two) times daily. 14 tablet 0   ezetimibe  (ZETIA ) 10 MG tablet Take 1 tablet (10 mg total) by mouth daily. 90 tablet 3   Glucosamine 500 MG CAPS Take 1 capsule by mouth daily.     Multiple Vitamin (MULTIVITAMIN) tablet Take 1 tablet by mouth daily.     naloxone  (NARCAN ) nasal spray 4 mg/0.1 mL One spray in either nostril once for known/suspected opioid overdose. May repeat every 2-3 minutes in alternating nostril til EMS arrives 2 each 1   Omega 3 340 MG CPDR Omega 3     oseltamivir  (TAMIFLU ) 75 MG capsule Take 1 capsule (75 mg total) by mouth 2 (two) times daily. 10 capsule 0   pregabalin  (LYRICA ) 150 MG capsule Take 1 capsule (150 mg total) by mouth 2 (two) times daily. 60 capsule 1   testosterone  cypionate (DEPOTESTOSTERONE CYPIONATE) 200 MG/ML injection 1  ml     tiZANidine  (ZANAFLEX ) 2 MG tablet Take 1 tablet (2 mg total) by mouth at bedtime as needed. 30 tablet 3   zolpidem  (AMBIEN ) 10 MG tablet Take 1 tablet (10 mg total) by mouth at bedtime as needed for insomnia.  90 tablet 1   [DISCONTINUED] amitriptyline  (ELAVIL ) 25 MG tablet Take 1 tablet (25 mg total) by mouth at bedtime. 30 tablet 5   No current facility-administered medications on file prior to visit.    Allergies  Allergen Reactions   Corticosteroids Other (See Comments)    irritability   Prednisone Swelling and Anxiety    Pt reports anxiety, insomnia, irritability   Other Reaction(s): Angioedema    Pt reports anxiety, insomnia, irritability  More of reaction    Blood pressure 133/68, pulse 87.   Assessment/Plan:  1. Hypertension -  Problem  Primary Hypertension   Current HTN meds: amlodipine /valsartan  10/320 mg daily and chlorthalidone  25 mg daily   Previously tried: irbesartan  300 mg daily and metoprolol  50 mg twice daily  BP goal: <130/80      Primary hypertension Assessment: In office BP 133/68 heart rate 87  goal (<130/80).   Patient takes current BP meds regularly and tolerates them well   Denies SOB, palpitation, chest pain, headaches,or swelling Post cancer neuralgia leads to variation in home BP - pt forgot to bring log  Cut back on salt intake  Pt was on beta-blocker (metoprolol ) stopped it due to ED   Plan:  Stop hydrochlorothiazide  25 mg and start taking chlorthalidone  25 mg daily  Continue taking  amlodipine /valsartan  10/320 mg daily and  Patient to keep record of BP readings with heart rate and report to us  at the next visit Patient to see PharmD in 4 weeks for follow up  Follow up lab(s) BMP In future can consider  adding spironolactone       Thank you  Robbi Blanch, Pharm.D [facility] Elspeth BIRCH. [facility] [address], [place], [place] [address] Phone: [phone]; Fax: [phone]

## 2024-01-25 ENCOUNTER — Other Ambulatory Visit (HOSPITAL_BASED_OUTPATIENT_CLINIC_OR_DEPARTMENT_OTHER): Payer: Self-pay

## 2024-01-26 ENCOUNTER — Other Ambulatory Visit (HOSPITAL_BASED_OUTPATIENT_CLINIC_OR_DEPARTMENT_OTHER): Payer: Self-pay

## 2024-01-26 MED ORDER — DOXYCYCLINE HYCLATE 100 MG PO TABS
100.0000 mg | ORAL_TABLET | Freq: Two times a day (BID) | ORAL | 1 refills | Status: AC
  Filled 2024-01-26: qty 28, 14d supply, fill #0

## 2024-01-27 ENCOUNTER — Other Ambulatory Visit (HOSPITAL_BASED_OUTPATIENT_CLINIC_OR_DEPARTMENT_OTHER): Payer: Self-pay

## 2024-02-01 ENCOUNTER — Other Ambulatory Visit (HOSPITAL_BASED_OUTPATIENT_CLINIC_OR_DEPARTMENT_OTHER): Payer: Self-pay

## 2024-02-02 ENCOUNTER — Other Ambulatory Visit (HOSPITAL_BASED_OUTPATIENT_CLINIC_OR_DEPARTMENT_OTHER): Payer: Self-pay

## 2024-02-02 ENCOUNTER — Encounter (HOSPITAL_BASED_OUTPATIENT_CLINIC_OR_DEPARTMENT_OTHER): Payer: Self-pay | Admitting: Emergency Medicine

## 2024-02-02 ENCOUNTER — Other Ambulatory Visit: Payer: Self-pay

## 2024-02-02 ENCOUNTER — Emergency Department (HOSPITAL_BASED_OUTPATIENT_CLINIC_OR_DEPARTMENT_OTHER)
Admission: EM | Admit: 2024-02-02 | Discharge: 2024-02-02 | Disposition: A | Discharge status: Home / Self Care | Attending: Emergency Medicine | Admitting: Emergency Medicine

## 2024-02-02 DIAGNOSIS — E039 Hypothyroidism, unspecified: Secondary | ICD-10-CM | POA: Diagnosis not present

## 2024-02-02 DIAGNOSIS — Z7901 Long term (current) use of anticoagulants: Secondary | ICD-10-CM | POA: Insufficient documentation

## 2024-02-02 DIAGNOSIS — Q181 Preauricular sinus and cyst: Secondary | ICD-10-CM

## 2024-02-02 DIAGNOSIS — H6002 Abscess of left external ear: Secondary | ICD-10-CM | POA: Diagnosis not present

## 2024-02-02 DIAGNOSIS — R220 Localized swelling, mass and lump, head: Secondary | ICD-10-CM | POA: Diagnosis present

## 2024-02-02 MED ORDER — LIDOCAINE HCL (PF) 1 % IJ SOLN
5.0000 mL | INTRAMUSCULAR | Status: DC
  Administered 2024-02-02: 5 mL

## 2024-02-02 MED ORDER — LIDOCAINE HCL (PF) 1 % IJ SOLN
5.0000 mL | Freq: Once | INTRAMUSCULAR | Status: AC
  Administered 2024-02-02: 5 mL
  Filled 2024-02-02: qty 5

## 2024-02-02 MED ORDER — CEPHALEXIN 500 MG PO CAPS
500.0000 mg | ORAL_CAPSULE | Freq: Four times a day (QID) | ORAL | 0 refills | Status: AC
  Filled 2024-02-02: qty 20, 5d supply, fill #0

## 2024-02-02 NOTE — ED Triage Notes (Signed)
 Reports cyst on left ear. Seen by derm for same and given antibiotics .   Doxy for 1 week

## 2024-02-02 NOTE — Discharge Instructions (Signed)
 It was a pleasure taking care of you today.  As discussed, I am adding an antibiotic.  Take as prescribed and finish all antibiotics.  Continue taking your doxycycline  as previously prescribed.  You may take over-the-counter ibuprofen or Tylenol as needed for pain.  I have included the number of the ear nose and throat doctor.  Call today to schedule an appointment for further evaluation. Please also see your dermatologist to rule out other skin lesions. Return to the ER for new or worsening symptoms.   Continue using warm compresses to area

## 2024-02-02 NOTE — ED Provider Notes (Signed)
 " [place] EMERGENCY DEPARTMENT AT [facility] Provider Note   CSN: [id number] Arrival date & time: 02/02/24  0930     Patient presents with: Cyst   Samuel Turner is a 79 y.o. male with a past medical history significant for hypothyroidism, depression, anxiety, hyperlipidemia, and history of melanoma who presents to the ED due to cyst to left ear.  Patient notes cyst has been present for 2 weeks.  Saw dermatology last week and was placed on doxycycline .  Has been taking it for 5 days. Area has not grown in size since being on doxycycline ; however there has been no improvement.  No fever or chills.  Does have a history of melanoma.  Patient notes dermatology was not concerned about skin cancer. No other lesions.   History obtained from patient and past medical records. No interpreter used during encounter.       Prior to Admission medications  Medication Sig Start Date End Date Taking? Authorizing Provider  cephALEXin  (KEFLEX ) 500 MG capsule Take 1 capsule (500 mg total) by mouth 4 (four) times daily. 02/02/24  Yes Zanna Hawn, Aleck JAYSON, PA-C  acetaminophen (TYLENOL) 500 MG tablet Take 500 mg by mouth.    [provider]  albuterol  (VENTOLIN  HFA) 108 (90 Base) MCG/ACT inhaler Inhale 2 puffs into the lungs as directed. 03/25/23   [provider]  albuterol  (VENTOLIN  HFA) 108 (90 Base) MCG/ACT inhaler Inhale 1-2 puffs into the lungs every 6 (six) hours as needed. 12/31/23     ALPRAZolam  (XANAX ) 0.5 MG tablet Take 0.5 mg by mouth at bedtime as needed for sleep.    [provider]  ALPRAZolam  (XANAX ) 0.5 MG tablet Take 1 tablet (0.5 mg total) by mouth daily. 11/17/23     amLODipine -valsartan  (EXFORGE ) 10-320 MG tablet Take 1 tablet by mouth daily. 09/13/23   Vannie Reche RAMAN, NP  apixaban  (ELIQUIS ) 5 MG TABS tablet Take 1 tablet (5 mg total) by mouth 2 (two) times daily for 20 days. 11/15/23 12/05/23  Vannie Reche RAMAN, NP  chlorthalidone  (HYGROTON ) 25 MG  tablet Take 1 tablet (25 mg total) by mouth daily. 01/21/24 04/20/24  Raford Riggs, MD  Cholecalciferol 50 MCG (2000 UT) CAPS 1 tablet Orally Once a day    [provider]  doxycycline  (VIBRA -TABS) 100 MG tablet Take 1 tablet (100 mg total) by mouth 2 (two) times daily with food/water. (sun warning) 01/26/24     ezetimibe  (ZETIA ) 10 MG tablet Take 1 tablet (10 mg total) by mouth daily. 09/02/23 03/20/24  Walker, Caitlin S, NP  Glucosamine 500 MG CAPS Take 1 capsule by mouth daily.    [provider]  Multiple Vitamin (MULTIVITAMIN) tablet Take 1 tablet by mouth daily.    [provider]  naloxone  (NARCAN ) nasal spray 4 mg/0.1 mL One spray in either nostril once for known/suspected opioid overdose. May repeat every 2-3 minutes in alternating nostril til EMS arrives 09/21/23     Omega 3 340 MG CPDR Omega 3    [provider]  oseltamivir  (TAMIFLU ) 75 MG capsule Take 1 capsule (75 mg total) by mouth 2 (two) times daily. 12/29/23     pregabalin  (LYRICA ) 150 MG capsule Take 1 capsule (150 mg total) by mouth 2 (two) times daily. 10/19/23   Kirsteins, Prentice BRAVO, MD  testosterone  cypionate (DEPOTESTOSTERONE CYPIONATE) 200 MG/ML injection 1  ml    [provider]  tiZANidine  (ZANAFLEX ) 2 MG tablet Take 1 tablet (2 mg total) by mouth at bedtime as  needed. 10/05/23     zolpidem  (AMBIEN ) 10 MG tablet Take 1 tablet (10 mg total) by mouth at bedtime as needed for insomnia. 08/25/23     amitriptyline  (ELAVIL ) 25 MG tablet Take 1 tablet (25 mg total) by mouth at bedtime. 04/19/23 05/25/23      Allergies: Corticosteroids and Prednisone    Review of Systems  Skin:  Positive for color change and wound.    Updated Vital Signs BP (!) 171/74   Pulse 71   Temp 97.8 F (36.6 C) (Oral)   Resp 16   SpO2 100%   Physical Exam Vitals and nursing note reviewed.  Constitutional:      General: He is not in acute distress.    Appearance: He is not ill-appearing.  HENT:      Head: Normocephalic.     Ears:     Comments: 1x1 area of induration to left ear. See photo below.  Eyes:     Pupils: Pupils are equal, round, and reactive to light.  Cardiovascular:     Rate and Rhythm: Normal rate and regular rhythm.     Pulses: Normal pulses.     Heart sounds: Normal heart sounds. No murmur heard.    No friction rub. No gallop.  Pulmonary:     Effort: Pulmonary effort is normal.     Breath sounds: Normal breath sounds.  Abdominal:     General: Abdomen is flat. There is no distension.     Palpations: Abdomen is soft.     Tenderness: There is no abdominal tenderness. There is no guarding or rebound.  Musculoskeletal:        General: Normal range of motion.     Cervical back: Neck supple.  Skin:    General: Skin is warm and dry.  Neurological:     General: No focal deficit present.     Mental Status: He is alert.  Psychiatric:        Mood and Affect: Mood normal.        Behavior: Behavior normal.     (all labs ordered are listed, but only abnormal results are displayed) Labs Reviewed - No data to display  EKG: None  Radiology: No results found.   .Incision and Drainage  Date/Time: 02/02/2024 10:41 AM  Performed by: Lorelle Aleck BROCKS, PA-C Authorized by: Lorelle Aleck BROCKS, PA-C   Consent:    Consent obtained:  Verbal   Consent given by:  Patient   Risks, benefits, and alternatives were discussed: yes     Risks discussed:  Bleeding, infection and incomplete drainage   Alternatives discussed:  No treatment and delayed treatment Universal protocol:    Procedure explained and questions answered to patient or proxy's satisfaction: yes     Relevant documents present and verified: yes     Test results available : yes     Imaging studies available: yes     Required blood products, implants, devices, and special equipment available: yes     Site/side marked: yes     Immediately prior to procedure, a time out was called: yes     Patient identity  confirmed:  Verbally with patient Location:    Type:  Cyst   Size:  1x1   Location:  Head   Head location:  L external ear Pre-procedure details:    Skin preparation:  Povidone-iodine Sedation:    Sedation type:  None Anesthesia:    Anesthesia method:  Local infiltration   Local anesthetic:  5 mL  lidocaine  (PF) 1 % Procedure type:    Complexity:  Simple Procedure details:    Ultrasound guidance: no     Needle aspiration: no     Incision types:  Single straight   Incision depth:  Dermal   Wound management:  Probed and deloculated and irrigated with saline   Drainage:  Bloody and serous   Drainage amount:  Scant   Wound treatment:  Wound left open   Packing materials:  None Post-procedure details:    Procedure completion:  Tolerated well, no immediate complications .Ultrasound ED Soft Tissue  Date/Time: 02/02/2024 10:43 AM  Performed by: Lorelle Aleck BROCKS, PA-C Authorized by: Lorelle Aleck BROCKS, PA-C   Procedure details:    Indications: localization of abscess     Transverse view:  Visualized   Longitudinal view:  Visualized   Images: archived     Limitations:  Positioning Location:    Location: head     Side:  Left Findings:     abscess present    Medications Ordered in the ED  lidocaine  (PF) (XYLOCAINE ) 1 % injection 5 mL (5 mLs Infiltration Given 02/02/24 1020)                                    Medical Decision Making Risk Prescription drug management.   This patient presents to the ED for concern of area on left ear, this involves an extensive number of treatment options, and is a complaint that carries with it a high risk of complications and morbidity.  The differential diagnosis includes abscess, cyst, malignancy, etc  79 year old male presents to the ED due to cyst to left ear x 2 weeks.  Seen by dermatology and placed on doxycycline  which he has been compliant with.  No improvement since being on Doxycyline.  Denies fever and chills.  Upon arrival  patient afebrile, not tachycardic or hypoxic.  Patient well-appearing on exam.  Has a 1 x 1 cm cyst to left ear.  See photo above.  Bedside ultrasound performed which demonstrated a small fluid collection.  I&D performed as noted above.  No purulent drainage.  Serous fluid.  Advised patient to continue taking doxycycline .  Will add Keflex .  ENT number given to patient at discharge and advised to call to schedule an appointment for further evaluation.  Also advised patient to follow-up with dermatology to rule out other skin lesions given no purulent drainage with I&D. Patient stable for discharge. Strict ED precautions discussed with patient. Patient states understanding and agrees to plan. Patient discharged home in no acute distress and stable vitals  Discussed with Dr. Dean who evaluated patient at bedside and agrees with assessment and plan.   Hx melanoma Elderly >65     Final diagnoses:  Cyst on ear    ED Discharge Orders          Ordered    cephALEXin  (KEFLEX ) 500 MG capsule  4 times daily        02/02/24 1039               Illiana Losurdo C, PA-C 02/02/24 1050    Dean Clarity, MD 02/02/24 1132  "

## 2024-02-11 ENCOUNTER — Other Ambulatory Visit (HOSPITAL_BASED_OUTPATIENT_CLINIC_OR_DEPARTMENT_OTHER): Payer: Self-pay

## 2024-03-02 ENCOUNTER — Ambulatory Visit (HOSPITAL_BASED_OUTPATIENT_CLINIC_OR_DEPARTMENT_OTHER): Admitting: Cardiovascular Disease
